# Patient Record
Sex: Male | Born: 1975 | Race: White | Hispanic: No | Marital: Married
Health system: Southern US, Community
[De-identification: ages and names within clinical notes are randomized; demographics above are authoritative.]

---

## 2020-04-20 DIAGNOSIS — Z20828 Contact with and (suspected) exposure to other viral communicable diseases: Secondary | ICD-10-CM | POA: Diagnosis not present

## 2020-10-20 DIAGNOSIS — M5440 Lumbago with sciatica, unspecified side: Secondary | ICD-10-CM | POA: Diagnosis not present

## 2021-05-15 DIAGNOSIS — M5441 Lumbago with sciatica, right side: Secondary | ICD-10-CM | POA: Diagnosis not present

## 2021-06-25 DIAGNOSIS — M545 Low back pain, unspecified: Secondary | ICD-10-CM | POA: Diagnosis not present

## 2021-08-23 DIAGNOSIS — M5441 Lumbago with sciatica, right side: Secondary | ICD-10-CM | POA: Diagnosis not present

## 2021-11-29 NOTE — Progress Notes (Signed)
?Terrilee Files D.O. ?[facility] ?[address] [place] [address] ?Phone: [phone] ?Subjective:   ?I, Richard Cline, am serving as a scribe for Dr. Antoine Primas. ? ?This visit occurred during the SARS-CoV-2 public health emergency.  Safety protocols were in place, including screening questions prior to the visit, additional usage of staff PPE, and extensive cleaning of exam room while observing appropriate contact time as indicated for disinfecting solutions.  ? ? ?I'm seeing this patient by the request  of:  Pcp, No ? ?CC: low back exam  ? ?[id number]  ?Richard Cline is a 46 y.o. male coming in with complaint of LBP. Patient states that he has pain since August 15th, 2022. Had back discectomy in 1997 on L4/L5. Has tried Prednisone and this helped his pain. Tried PRP in December as well as a nerve block. Pain is now in R piriformis that radiates down into his R calf. Patient prefers to stand today due to pain. Pain is 4/10 constantly. Patient tried PT and this made his back worse. Using Egoscue stretching method currently for pain relief.  ? ? ?  ? ?Social History  ? ?Socioeconomic History  ? Marital status: Married  ?  Spouse name: Not on file  ? Number of children: Not on file  ? Years of education: Not on file  ? Highest education level: Not on file  ?Occupational History  ? Not on file  ?Tobacco Use  ? Smoking status: Not on file  ? Smokeless tobacco: Not on file  ?Substance and Sexual Activity  ? Alcohol use: Not on file  ? Drug use: Not on file  ? Sexual activity: Not on file  ?Other Topics Concern  ? Not on file  ?Social History Narrative  ? Not on file  ? ?Social Determinants of Health  ? ?Financial Resource Strain: Not on file  ?Food Insecurity: Not on file  ?Transportation Needs: Not on file  ?Physical Activity: Not on file  ?Stress: Not on file  ?Social Connections: Not on file  ? ?Not on File ?No family history on file. ? ? ? ? ? ? ?Current Outpatient Medications (Other):  ?   gabapentin (NEURONTIN) 100 MG capsule, Take 2 capsules (200 mg total) by mouth at bedtime. ? ? ?Reviewed prior external information including notes and imaging from  ?primary care provider ?As well as notes that were available from care everywhere and other healthcare systems. ? ?Past medical history, social, surgical and family history all reviewed in electronic medical record.  No pertanent information unless stated regarding to the chief complaint.  ? ?Review of Systems: ? No headache, visual changes, nausea, vomiting, diarrhea, constipation, dizziness, abdominal pain, skin rash, fevers, chills, night sweats, weight loss, swollen lymph nodes, body aches, joint swelling, chest pain, shortness of breath, mood changes. POSITIVE muscle aches ? ?Objective  ?Blood pressure 120/90, pulse (!) 101, height 6' (1.829 m), weight 237 lb (107.5 kg), SpO2 98 %. ?  ?General: No apparent distress alert and oriented x3 mood and affect normal, dressed appropriately.  ?HEENT: Pupils equal, extraocular movements intact  ?Respiratory: Patient's speak in full sentences and does not appear short of breath  ?Cardiovascular: No lower extremity edema, non tender, no erythema  ?Gait normal with good balance and coordination.  ?MSK: Patient back exam shows the patient has significant loss of lordosis.  Significant positive radicular symptoms of the right side at 20 degrees.  Patient has mild weakness with dorsiflexion of the foot but does have 2+  out of 5 strength of the hip noted with hip abduction.  Patient also has weakness with hip flexion with 3 out of 5 strength compared to the contralateral side.  Deep tendon reflexes though do appear to be intact.  Patient does have atrophy of approximately half centimeter in diameter of the calf as well as on the distal quadricep compared to the contralateral leg.  Patient has some atrophy of the gluteal musculature on the right side as well positive Trendelenburg on the right ? ?  ?Impression and  Recommendations:  ?  ? ?The above documentation has been reviewed and is accurate and complete Judi Saa, DO ? ? ? ?

## 2021-11-30 ENCOUNTER — Other Ambulatory Visit: Payer: Self-pay

## 2021-11-30 ENCOUNTER — Ambulatory Visit (INDEPENDENT_AMBULATORY_CARE_PROVIDER_SITE_OTHER): Payer: BLUE CROSS/BLUE SHIELD | Admitting: Family Medicine

## 2021-11-30 ENCOUNTER — Ambulatory Visit (INDEPENDENT_AMBULATORY_CARE_PROVIDER_SITE_OTHER): Payer: BLUE CROSS/BLUE SHIELD

## 2021-11-30 VITALS — BP 120/90 | HR 101 | Ht 72.0 in | Wt 237.0 lb

## 2021-11-30 DIAGNOSIS — M5416 Radiculopathy, lumbar region: Secondary | ICD-10-CM | POA: Diagnosis not present

## 2021-11-30 DIAGNOSIS — M545 Low back pain, unspecified: Secondary | ICD-10-CM | POA: Diagnosis not present

## 2021-11-30 DIAGNOSIS — G8929 Other chronic pain: Secondary | ICD-10-CM

## 2021-11-30 DIAGNOSIS — R102 Pelvic and perineal pain: Secondary | ICD-10-CM | POA: Diagnosis not present

## 2021-11-30 DIAGNOSIS — M47816 Spondylosis without myelopathy or radiculopathy, lumbar region: Secondary | ICD-10-CM | POA: Diagnosis not present

## 2021-11-30 IMAGING — DX DG PELVIS 1-2V
1 series · 1 of 1 positions shown · non-contrast
Comparison: None

CLINICAL DATA: Low back pain, right hip radiculopathy

EXAM:
PELVIS - 1-2 VIEW

[pelvis ap]
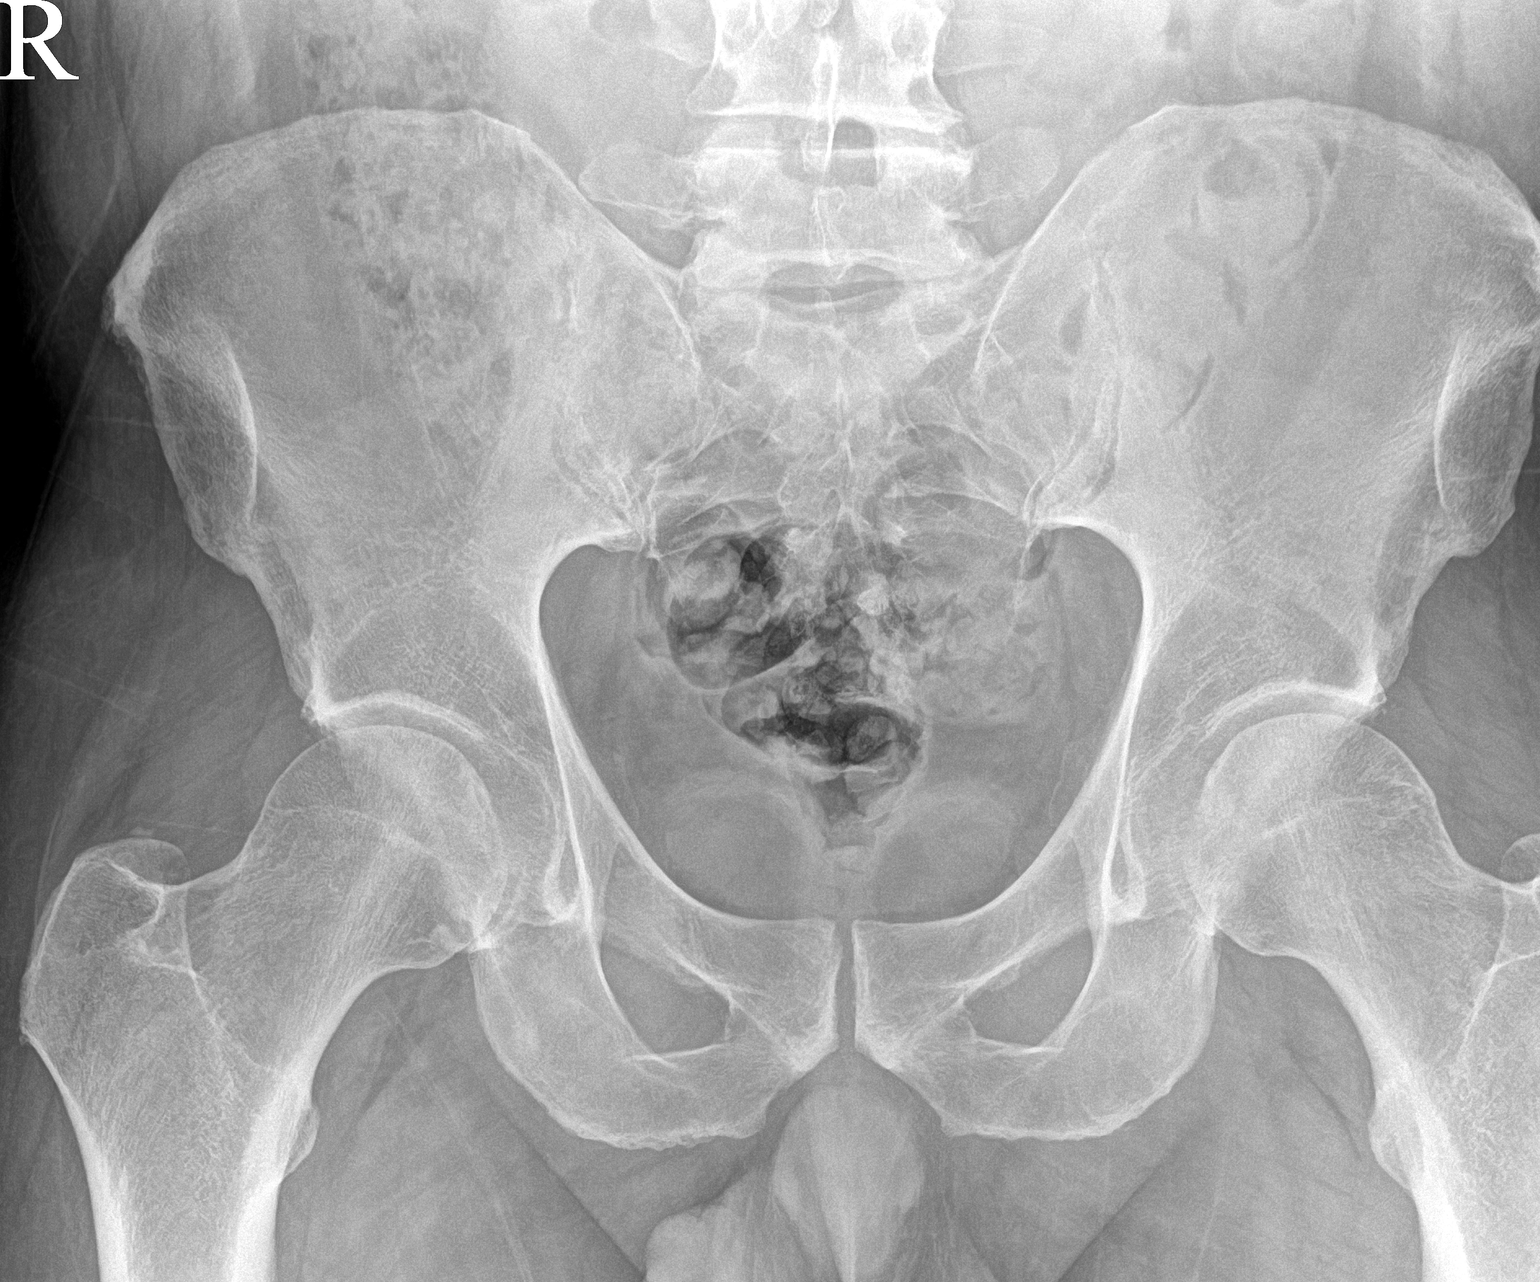

[1 of 1 positions shown; findings below may reference images not displayed]

FINDINGS: Supine frontal view of the pelvis excludes portions of the proximal
left femur and left iliac crest by collimation. No fracture,
subluxation, or dislocation. Joint spaces are well preserved. Soft
tissues are unremarkable.
IMPRESSION: 1. Unremarkable pelvis.

## 2021-11-30 IMAGING — DX DG LUMBAR SPINE 2-3V
3 series · 3 of 3 positions shown · non-contrast
Comparison: None.

CLINICAL DATA: Low back pain, right hip radiculopathy

EXAM:
LUMBAR SPINE - 2-3 VIEW

[l-spine ap]
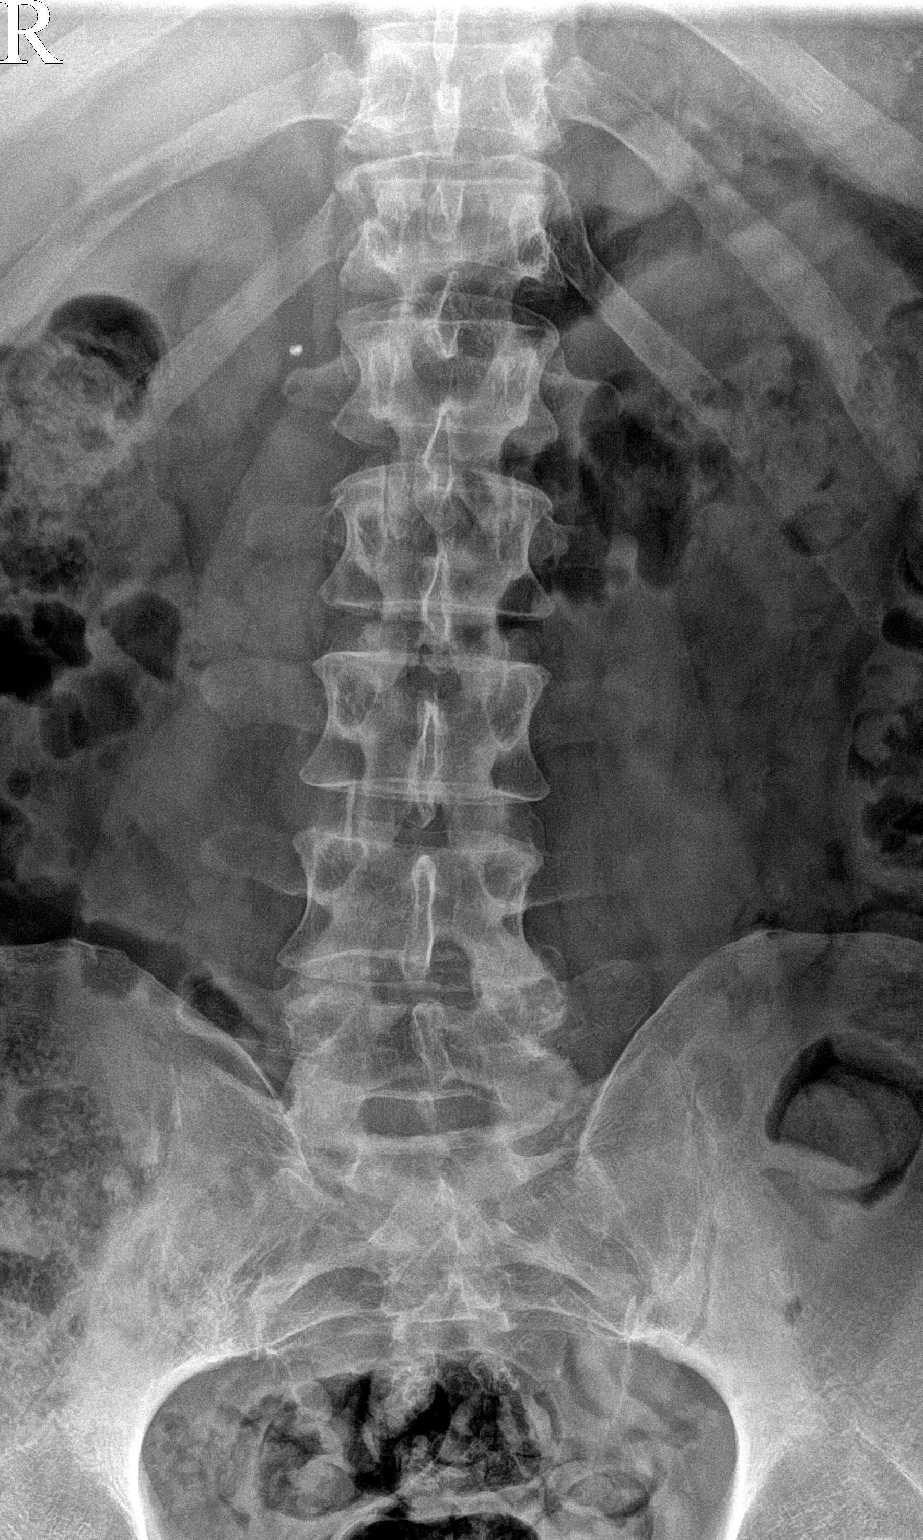

[l-spine lateral]
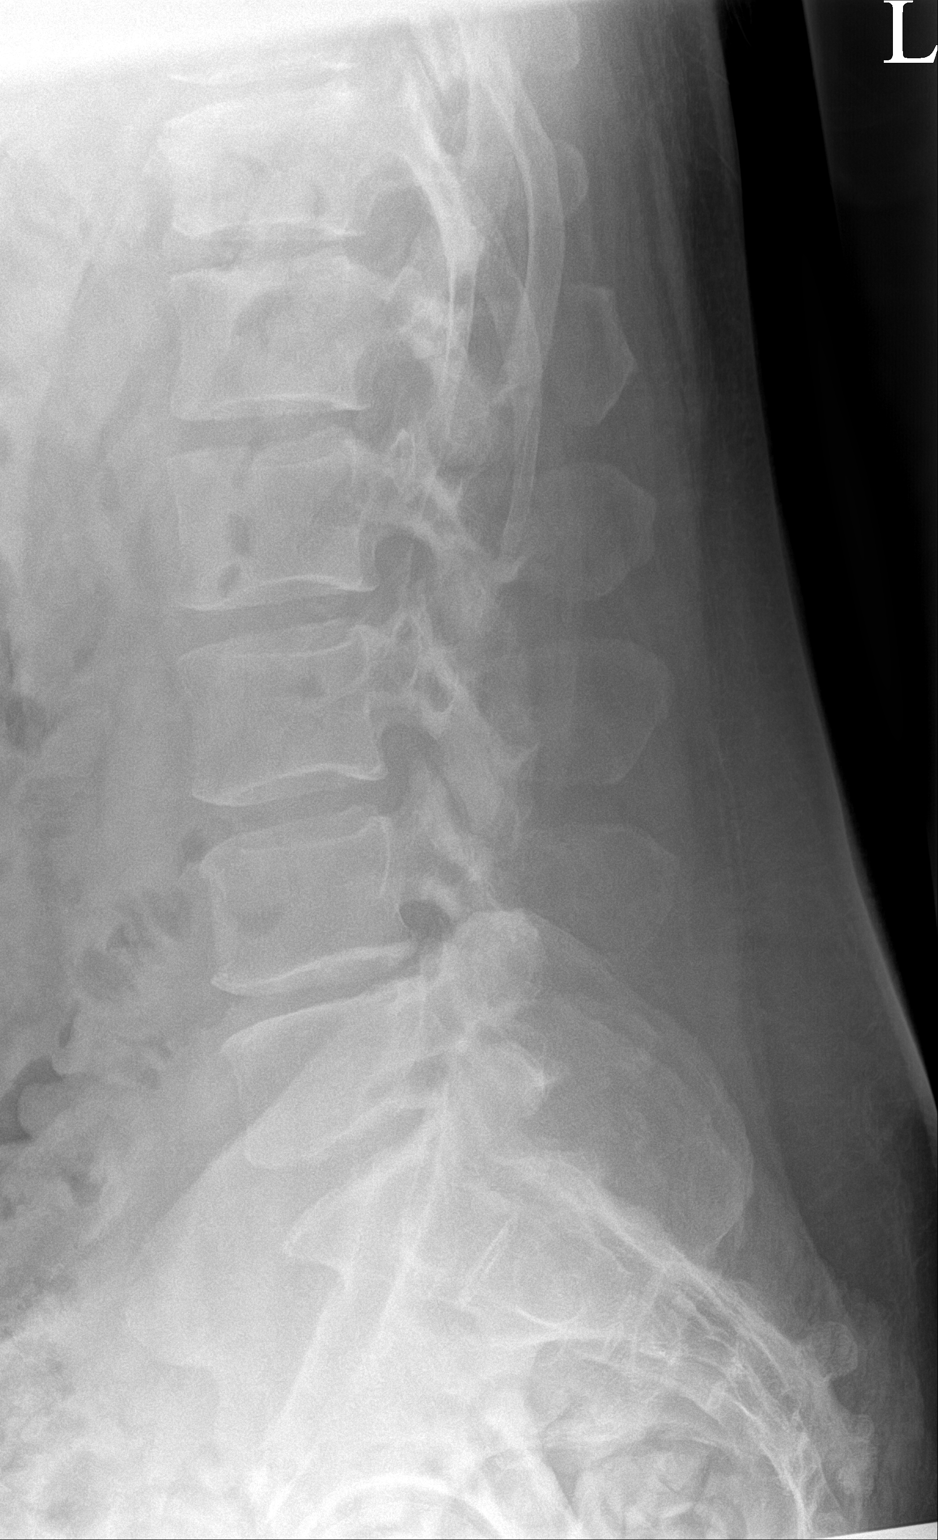

[l-spine spot]
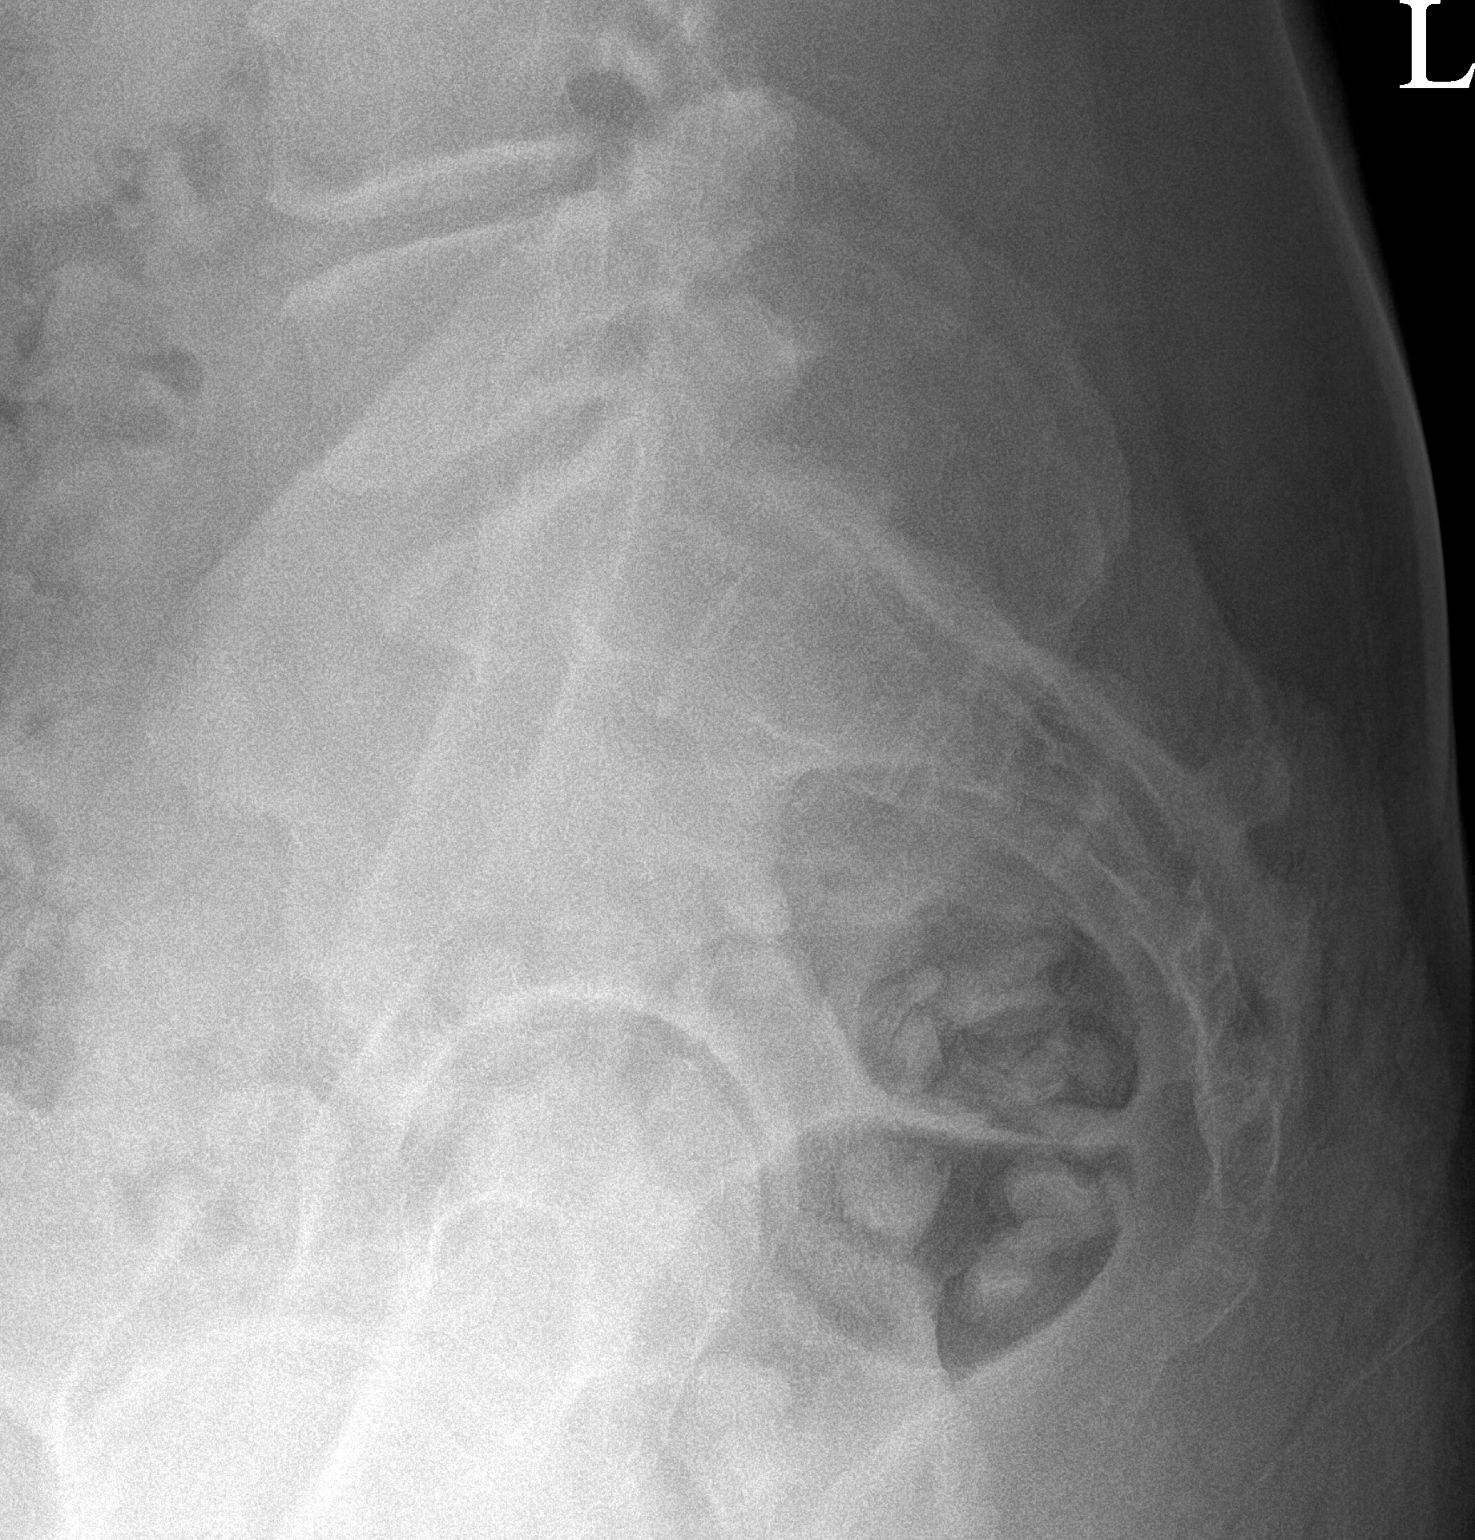

[3 of 3 positions shown; findings below may reference images not displayed]

FINDINGS: Frontal and lateral views of the lumbar spine are obtained. Five
non-rib-bearing lumbar type vertebral bodies in grossly normal
anatomic alignment. There is mild spondylosis and facet hypertrophy
at L4-5 and L5-S1. Sacroiliac joints are unremarkable.
IMPRESSION: 1. Mild lower lumbar spondylosis and facet hypertrophy. No acute
bony abnormality.

## 2021-11-30 MED ORDER — GABAPENTIN 100 MG PO CAPS: 200.0000 mg | ORAL_CAPSULE | Freq: Every day | ORAL | 0 refills | Status: AC

## 2021-11-30 NOTE — Patient Instructions (Addendum)
Xray lumbar and pelvis ?Gabapentin 200mg  at night ?MRI lumbar and pelvis ?Sign up for MyChart ?Make appt in 6 weeks ?

## 2021-11-30 NOTE — Assessment & Plan Note (Signed)
Low back does have some loss lordosis.  Some tenderness to palpation in the paraspinal musculature.  Patient unfortunately does have atrophy of the right lower extremity compared to the contralateral side.  Positive straight leg test noted with radicular symptoms.  Patient does have some weakness with hip flexion.  With this pain going on greater than 6 months and seen multiple providers with no significant improvement I am concerned that there is a fairly significant nerve root impingement causing this problem.  Started on gabapentin but I do feel x-rays and MRI are necessary at this time.  Patient is 46 years old and is unable to do stairs secondary to the amount of pain and weakness that he is having.  Depending on findings we will discuss different treatment options such as epidurals, nerve root injections, or even other conservative therapy follow-up with me again after imaging. ?

## 2021-12-15 ENCOUNTER — Other Ambulatory Visit: Payer: BLUE CROSS/BLUE SHIELD

## 2021-12-24 ENCOUNTER — Ambulatory Visit
Admission: RE | Admit: 2021-12-24 | Discharge: 2021-12-24 | Disposition: A | Discharge status: Home / Self Care | Payer: BLUE CROSS/BLUE SHIELD | Source: Ambulatory Visit | Attending: Family Medicine | Admitting: Family Medicine

## 2021-12-24 DIAGNOSIS — M48061 Spinal stenosis, lumbar region without neurogenic claudication: Secondary | ICD-10-CM | POA: Diagnosis not present

## 2021-12-24 DIAGNOSIS — M533 Sacrococcygeal disorders, not elsewhere classified: Secondary | ICD-10-CM | POA: Diagnosis not present

## 2021-12-24 DIAGNOSIS — M545 Low back pain, unspecified: Secondary | ICD-10-CM | POA: Diagnosis not present

## 2021-12-24 DIAGNOSIS — M47816 Spondylosis without myelopathy or radiculopathy, lumbar region: Secondary | ICD-10-CM | POA: Diagnosis not present

## 2021-12-24 DIAGNOSIS — M5416 Radiculopathy, lumbar region: Secondary | ICD-10-CM | POA: Diagnosis not present

## 2021-12-24 IMAGING — MR MR PELVIS W/O CM
5 of 8 series · 18 of 48 positions shown · non-contrast
Comparison: Pelvic radiographs [DATE] lumbar MRI [DATE]

CLINICAL DATA: Chronic low back pain radiating into the right lower
extremity. Previous back surgery.

EXAM:
MRI PELVIS WITHOUT CONTRAST
TECHNIQUE: Multiplanar multisequence MR imaging of the pelvis was performed. No
intravenous contrast was administered.

[Series 3: T1 · coronal · 3.0mm · 1.06mm/px · 4 of 44 slices shown (1 of 2)]
[im 1/44]
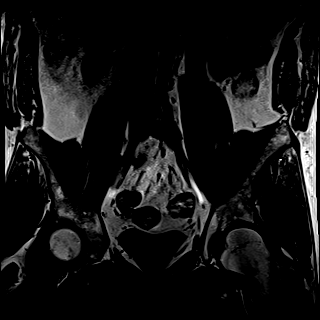
[im 15/44]
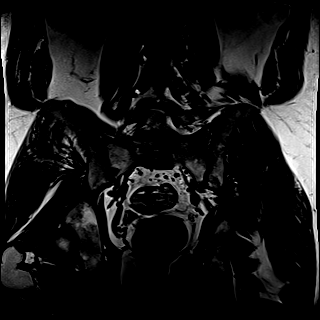
[im 29/44]
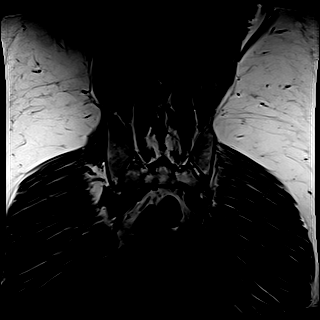
[im 44/44]
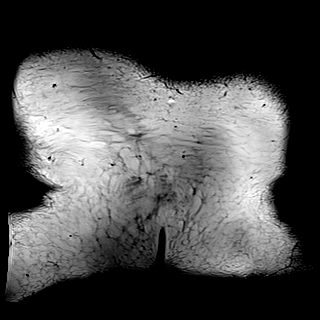

[Series 5: T2 · sagittal · 4.0mm · 1.12mm/px · 1 of 20 slices shown]
[im 1/20]
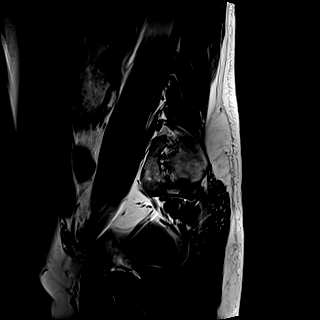

[Series 8: T2 fat-sat · axial · 4.0mm · 0.56mm/px · z∈[-285,+60]mm · 5 of 70 slices shown]
[im 1/70]
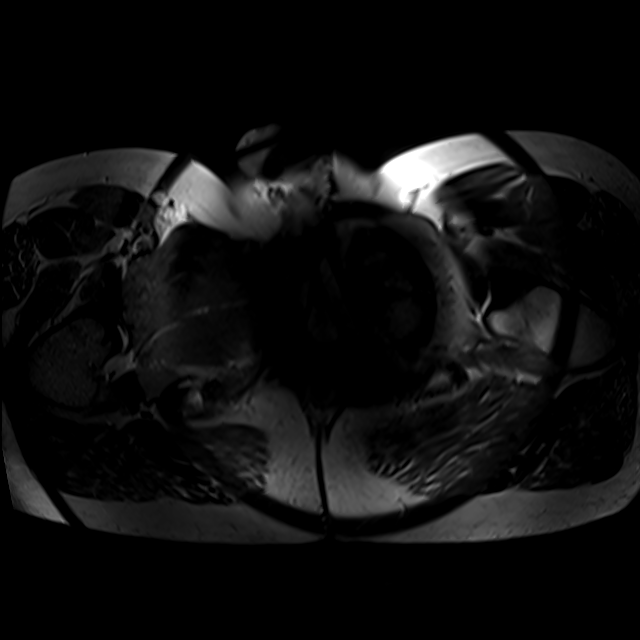
[im 18/70]
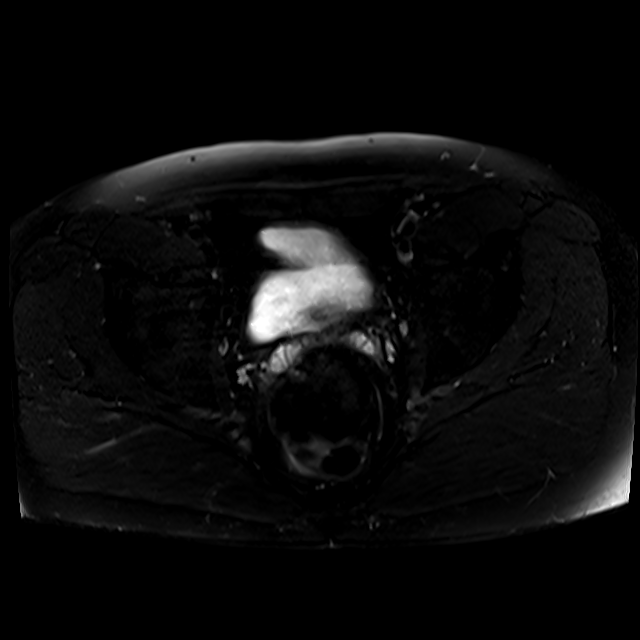
[im 35/70]
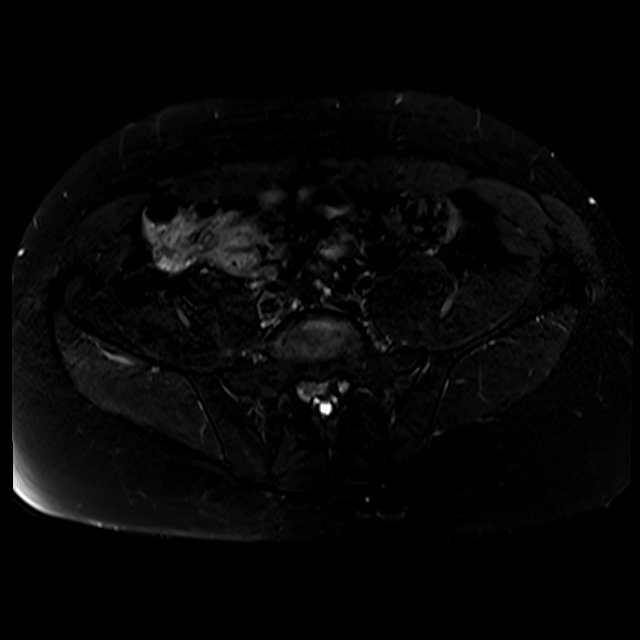
[im 52/70]
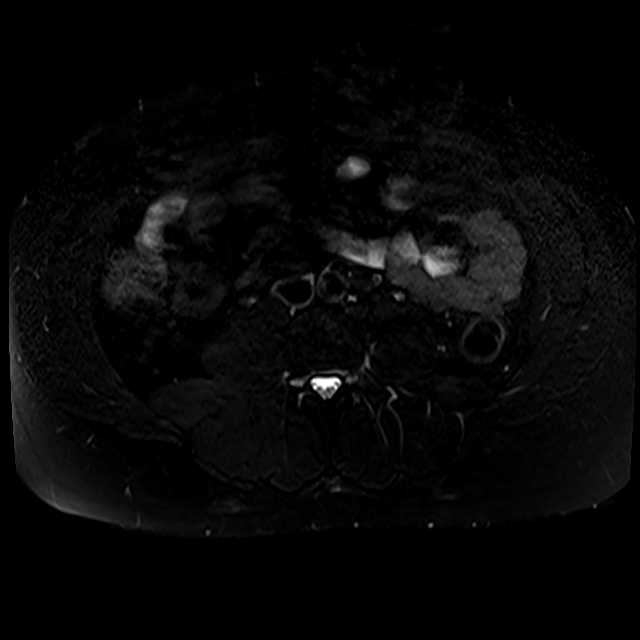
[im 70/70]
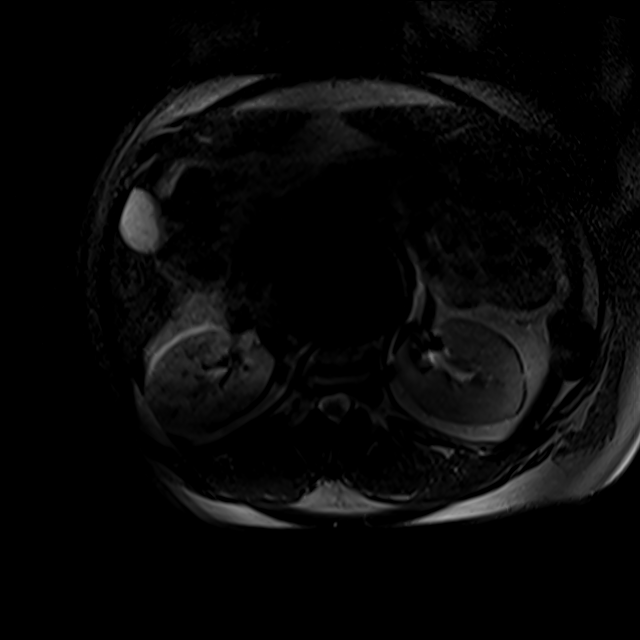

[Series 9: T1 · axial · 4.0mm · 1.41mm/px · z∈[-285,+60]mm · 5 of 70 slices shown (2 of 2)]
[im 1/70]
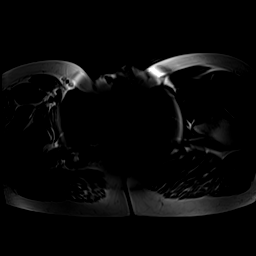
[im 18/70]
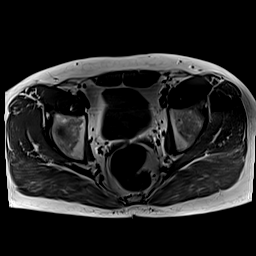
[im 35/70]
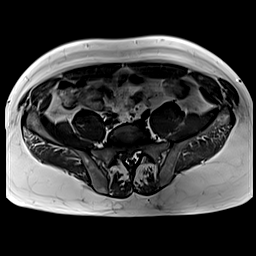
[im 52/70]
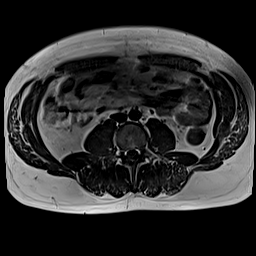
[im 70/70]
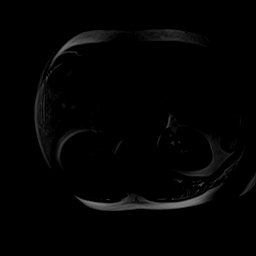

[Series 10: t2_space_stir_cor · coronal · 4.0mm · 0.88mm/px · 3 of 44 slices shown]
[im 1/44]
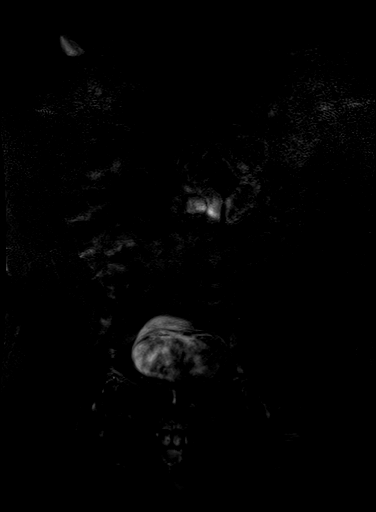
[im 22/44]
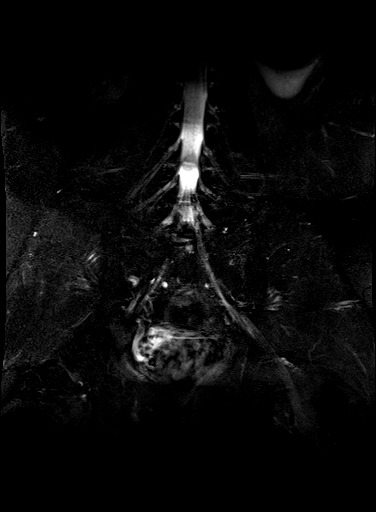
[im 44/44]
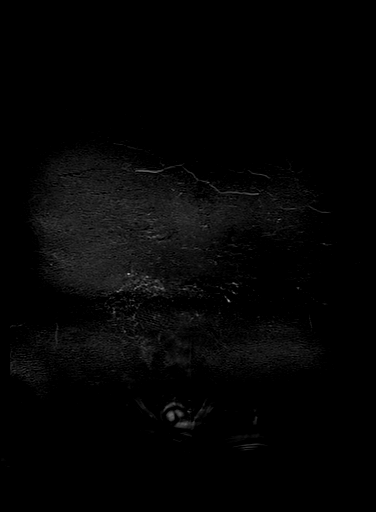

[18 of 48 positions shown; findings below may reference images not displayed]

FINDINGS: Urinary Tract: The visualized distal ureters and bladder appear
unremarkable.

Bowel: No bowel wall thickening, distention or surrounding
inflammation identified within the pelvis.

Vascular/Lymphatic: No enlarged pelvic lymph nodes identified. No
significant vascular findings.

Reproductive: The prostate gland and seminal vesicles appear
unremarkable.

Other: No evidence of pelvic ascites. The visualized anterior
abdominal wall is intact.

Musculoskeletal: No evidence of acute fracture, dislocation or
femoral head avascular necrosis. The hip joint spaces are preserved,
and there is no significant hip joint effusion. Mild sacroiliac
degenerative changes are present bilaterally. Lower lumbar spine
findings are dictated separately. The pelvic musculature appears
symmetric. The visualized pelvic tendons are intact.
IMPRESSION: 1. Sacroiliac degenerative changes bilaterally.
2. No acute pelvic findings.
3. The pelvic musculature and tendons appear unremarkable.
4. Lumbar spine findings dictated separately.

## 2021-12-24 IMAGING — MR MR LUMBAR SPINE W/O CM
4 of 5 series · 16 of 48 positions shown · non-contrast
Comparison: Lumbar spine radiographs [DATE].

CLINICAL DATA: Lumbar radiculopathy. Lumbar spine pain. Additional
history provided by scanning technologist: Patient reports chronic
pain for years, worse over the past 8 months. Prior microdiscectomy
surgeries 20 years ago. Pain radiates to right lower extremity.

EXAM:
MRI LUMBAR SPINE WITHOUT CONTRAST
TECHNIQUE: Multiplanar, multisequence MR imaging of the lumbar spine was
performed. No intravenous contrast was administered.

[Series 5: T2 · sagittal · 4.0mm · 0.73mm/px · 5 of 15 slices shown (1 of 2)]
[im 1/15]
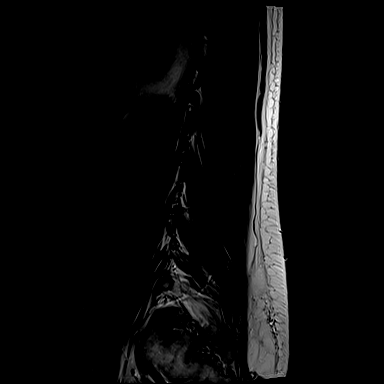
[im 4/15]
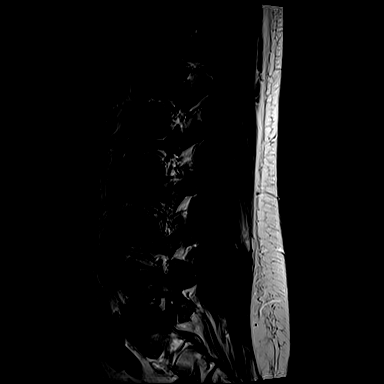
[im 8/15]
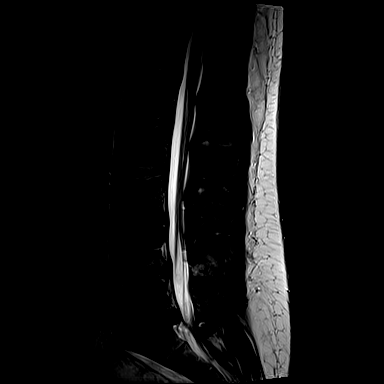
[im 11/15]
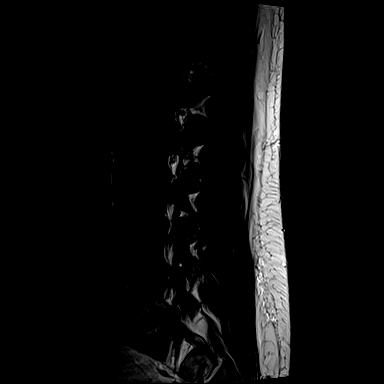
[im 15/15]
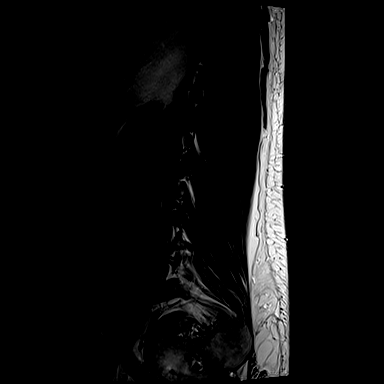

[Series 6: T1 · sagittal · 4.0mm · 0.73mm/px · 3 of 15 slices shown (1 of 2)]
[im 1/15]
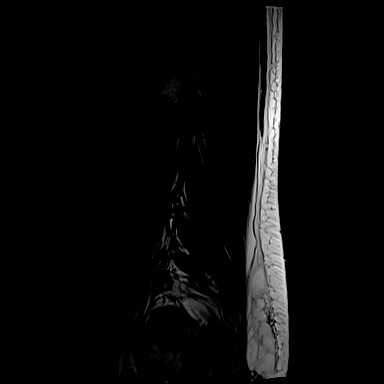
[im 8/15]
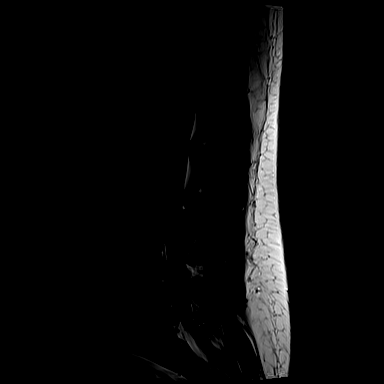
[im 15/15]
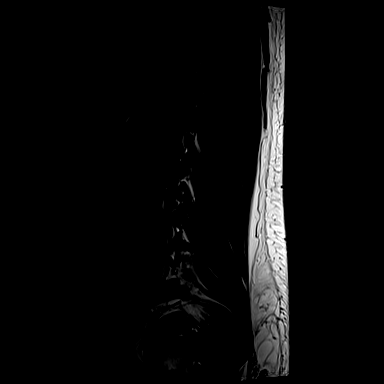

[Series 10: T1 · axial · 4.0mm · 0.28mm/px · z∈[-112,+58]mm · 3 of 42 slices shown (2 of 2)]
[im 6/42]
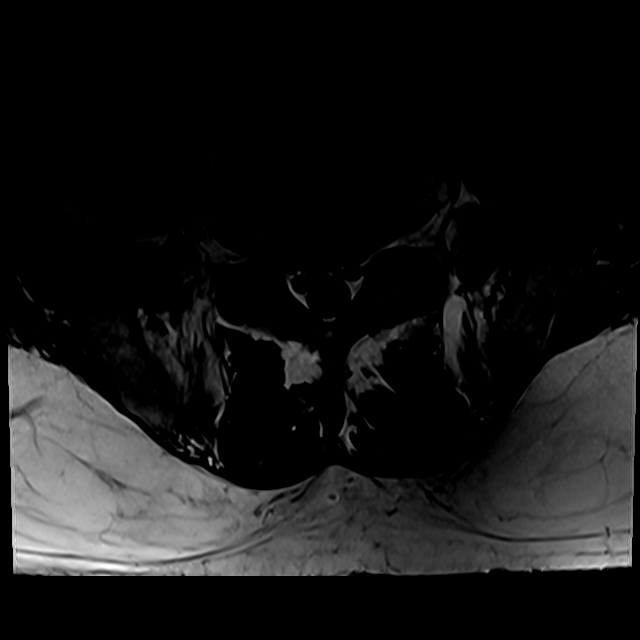
[im 22/42]
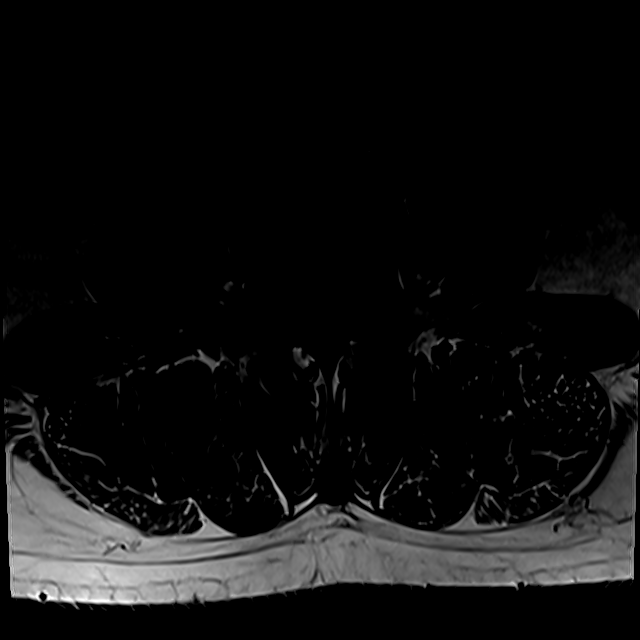
[im 36/42]
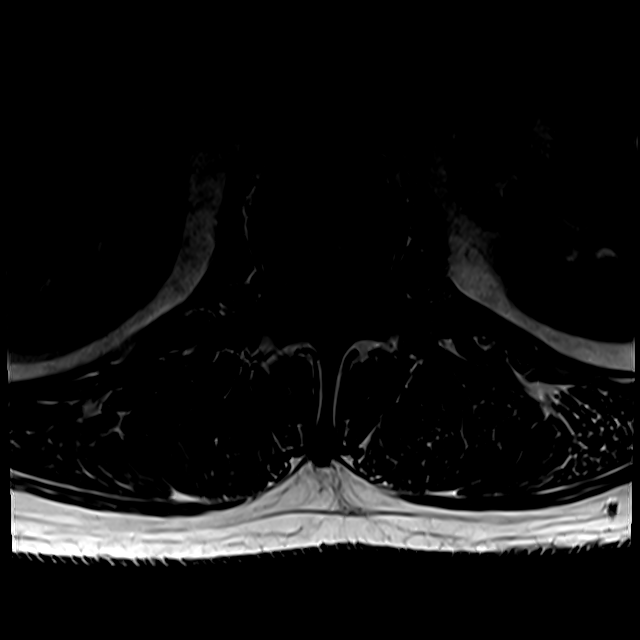

[Series 13: T2 · axial · 4.0mm · 0.28mm/px · z∈[-127,+58]mm · 5 of 42 slices shown (2 of 2)]
[im 3/42]
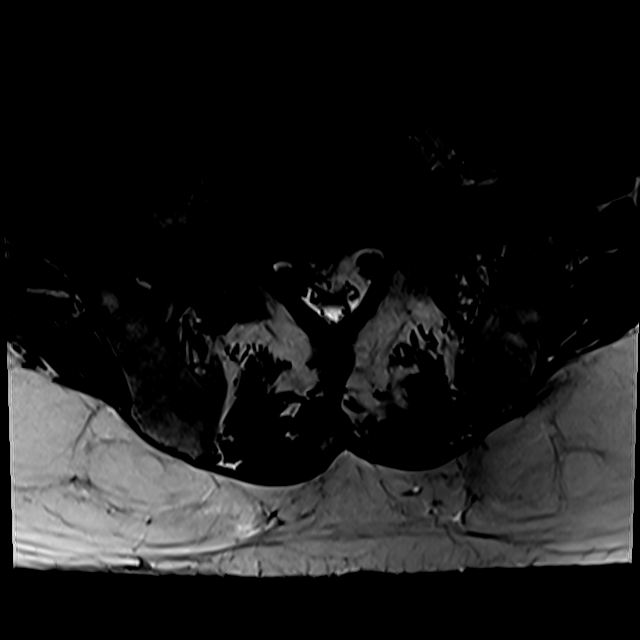
[im 6/42]
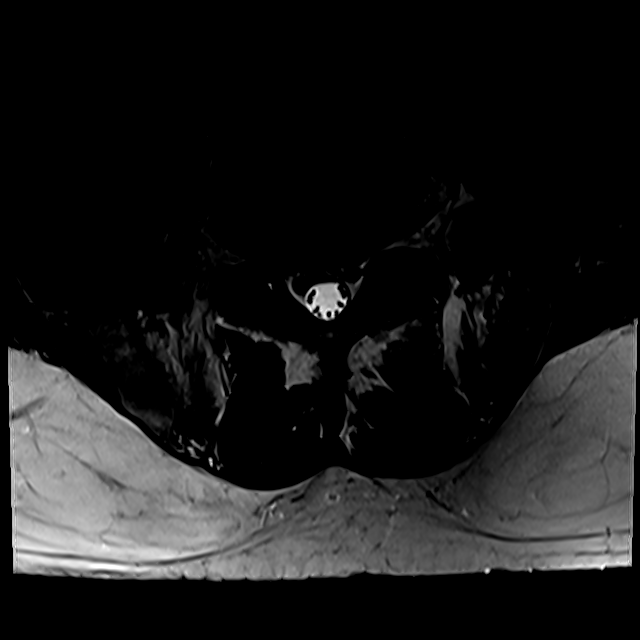
[im 9/42]
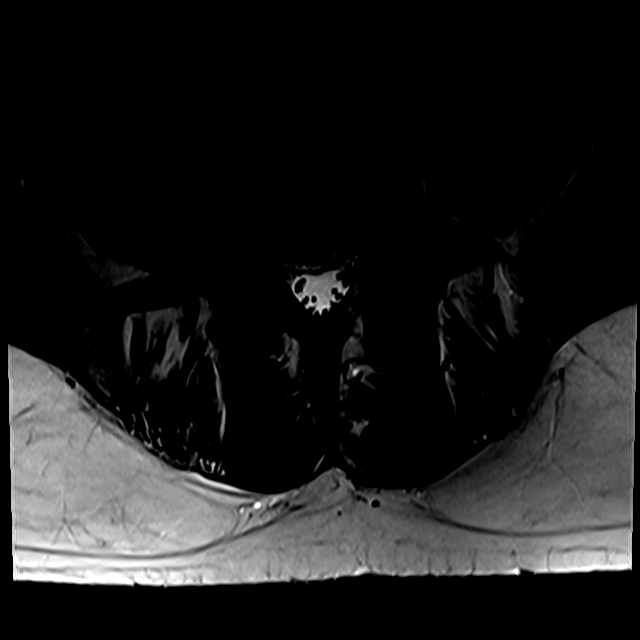
[im 22/42]
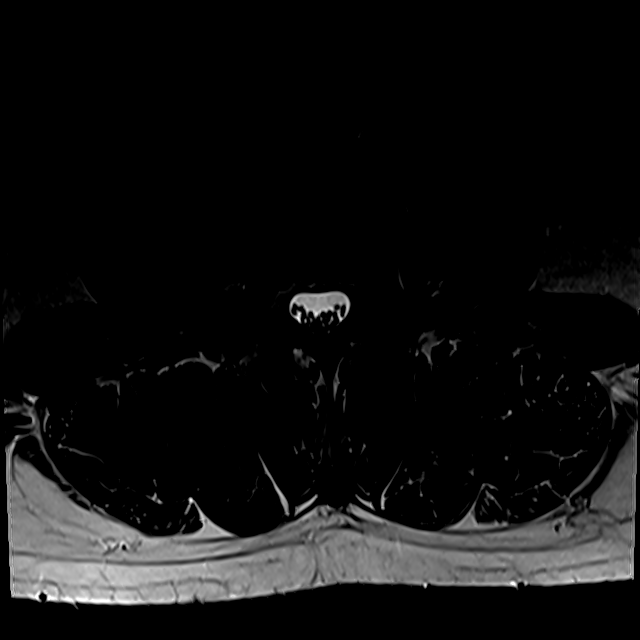
[im 36/42]
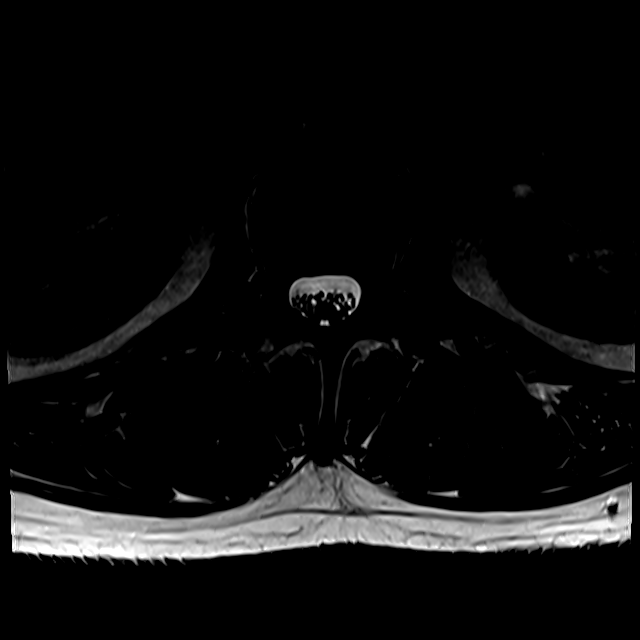

[16 of 48 positions shown; findings below may reference images not displayed]

FINDINGS: Segmentation: 5 lumbar vertebrae. The caudal most well-formed
intervertebral disc space is designated L5-S1.

Alignment:  No significant spondylolisthesis.

Vertebrae: No lumbar vertebral compression fracture. Multilevel
degenerative endplate irregularity. Fatty degenerative endplate
marrow signal at L4-L5 and L5-S1. Superimposed mild degenerative
endplate edema at L4-L5. Multilevel ventrolateral osteophytes.

Conus medullaris and cauda equina: Conus extends to the L2 level. No
signal abnormality within the visualized distal spinal cord.

Paraspinal and other soft tissues: No abnormality identified within
included portions of the abdomen/retroperitoneum. No paraspinal
mass.

Disc levels:

Multilevel disc degeneration, greatest at L4-L5 (moderate at this
level).

T11-T12: This level is imaged in the sagittal plane only. Small disc
bulge. Facet arthrosis and ligamentum flavum hypertrophy. No
significant spinal canal stenosis. Mild right neural foraminal
narrowing.

T12-L1: Small right center disc protrusion. The disc protrusion
results in minimal focal effacement of the ventral thecal sac
(without spinal cord mass effect). No significant foraminal
stenosis.

L1-L2: Mild facet arthrosis. No significant disc herniation or
stenosis.

L2-L3: Mild facet arthrosis. No significant disc herniation or
stenosis.

L3-L4: Central/right center posterior annular fissure. Disc bulge
with minimal endplate spurring. Mild facet arthrosis with ligamentum
flavum hypertrophy. Mild bilateral subarticular narrowing, greater
on the left, without nerve root impingement. Central canal patent.
No significant foraminal stenosis.

L4-L5: Prior left laminotomy and discectomy. Disc bulge with
endplate osteophytes. Possible superimposed tiny residual/recurrent
left center disc protrusion (series 5, image 9). Mild facet
arthrosis. No significant spinal canal stenosis. Mild left neural
foraminal narrowing.

L5-S1: Disc bulge. Superimposed broad-based central to right
subarticular disc extrusion with mild caudal migration. Endplate
spurring greatest along the right aspect of the disc space. The disc
extrusion results in severe right subarticular stenosis, encroaching
upon, and posteriorly displacing, the descending right S1 nerve root
(series 13, image 38). The disc extrusion also contributes to mild
left subarticular narrowing and may contact the descending left S1
nerve root. Mild bilateral neural foraminal narrowing (greater on
the right).
IMPRESSION: Lumbar spondylosis, as outlined and with findings most notably as
follows.

At L5-S1, a broad-based central to right subarticular caudally
migrated disc extrusion results in severe right subarticular
stenosis. The disc extrusion encroaches upon, and posteriorly
displaces, the descending right S1 nerve root. The disc extrusion
also contributes to mild left subarticular narrowing, and may
contact the descending left S1 nerve root. Multifactorial mild
bilateral neural foraminal narrowing.

At L4-L5, there is moderate disc degeneration with mild degenerative
endplate edema. There has been prior laminotomy and discectomy. Disc
bulge with endplate osteophytes. Possible superimposed tiny
residual/recurrent left center disc protrusion. Mild facet
arthrosis. No significant spinal canal stenosis. Mild left neural
foraminal narrowing.

At L3-L4, there is multifactorial mild left greater than right
subarticular narrowing (without appreciable nerve root impingement).

## 2021-12-26 ENCOUNTER — Encounter: Payer: Self-pay | Admitting: Family Medicine

## 2021-12-28 ENCOUNTER — Other Ambulatory Visit: Payer: Self-pay

## 2021-12-28 DIAGNOSIS — M5416 Radiculopathy, lumbar region: Secondary | ICD-10-CM

## 2022-01-04 ENCOUNTER — Ambulatory Visit
Admission: RE | Admit: 2022-01-04 | Discharge: 2022-01-04 | Disposition: A | Discharge status: Home / Self Care | Payer: BLUE CROSS/BLUE SHIELD | Source: Ambulatory Visit | Attending: Family Medicine | Admitting: Family Medicine

## 2022-01-04 DIAGNOSIS — M48061 Spinal stenosis, lumbar region without neurogenic claudication: Secondary | ICD-10-CM | POA: Diagnosis not present

## 2022-01-04 DIAGNOSIS — M5416 Radiculopathy, lumbar region: Secondary | ICD-10-CM | POA: Diagnosis not present

## 2022-01-04 MED ORDER — IOPAMIDOL (ISOVUE-M 200) INJECTION 41%
1.0000 mL | Freq: Once | INTRAMUSCULAR | Status: AC
Start: 2022-01-04 — End: 2022-01-04
  Administered 2022-01-04: 1 mL via EPIDURAL

## 2022-01-04 MED ORDER — METHYLPREDNISOLONE ACETATE 40 MG/ML INJ SUSP (RADIOLOG
80.0000 mg | Freq: Once | INTRAMUSCULAR | Status: AC
  Administered 2022-01-04: 80 mg via EPIDURAL

## 2022-01-04 NOTE — Discharge Instructions (Signed)

## 2022-01-18 NOTE — Progress Notes (Signed)
?Terrilee Files D.O. ?[facility] ?[address] [place] [address] ?Phone: [phone] ?Subjective:   ?I, Wilford Grist, am serving as a scribe for Dr. Antoine Primas. ? ?This visit occurred during the SARS-CoV-2 public health emergency.  Safety protocols were in place, including screening questions prior to the visit, additional usage of staff PPE, and extensive cleaning of exam room while observing appropriate contact time as indicated for disinfecting solutions.  ? ? ?I'm seeing this patient by the request  of:  Pcp, No ? ?CC: Back pain follow-up ? ?[id number]  ?11/30/2021 ?Low back does have some loss lordosis.  Some tenderness to palpation in the paraspinal musculature.  Patient unfortunately does have atrophy of the right lower extremity compared to the contralateral side.  Positive straight leg test noted with radicular symptoms.  Patient does have some weakness with hip flexion.  With this pain going on greater than 6 months and seen multiple providers with no significant improvement I am concerned that there is a fairly significant nerve root impingement causing this problem.  Started on gabapentin but I do feel x-rays and MRI are necessary at this time.  Patient is 46 years old and is unable to do stairs secondary to the amount of pain and weakness that he is having.  Depending on findings we will discuss different treatment options such as epidurals, nerve root injections, or even other conservative therapy follow-up with me again after imaging. ? ?Update 01/22/2022 ?Richard Cline is a 46 y.o. male coming in with complaint of lumbar spine pain. Epidural on 01/04/2022. Patient states that he is better. No longer in constant pain. Pain level when present is 3/10. Moving quickly and picking up items still increases his pain. Still not able to exercise how he would like to. Pain is localized to lumbar spine. If he has tingling it will be in back of R leg.  Reviewed patient's MRI in great  detail.  Patient did have a disc protrusion with extrusion noted causing displacement and impingement on the right S1 nerve root is significantly consistent with patient's pain.  Did have the injection on April 28.  Would state that he is 80% better but is still unable to increase his activity. ? ? ? ?Social History  ? ?Socioeconomic History  ? Marital status: Married  ?  Spouse name: Not on file  ? Number of children: Not on file  ? Years of education: Not on file  ? Highest education level: Not on file  ?Occupational History  ? Not on file  ?Tobacco Use  ? Smoking status: Not on file  ? Smokeless tobacco: Not on file  ?Substance and Sexual Activity  ? Alcohol use: Not on file  ? Drug use: Not on file  ? Sexual activity: Not on file  ?Other Topics Concern  ? Not on file  ?Social History Narrative  ? Not on file  ? ?Social Determinants of Health  ? ?Financial Resource Strain: Not on file  ?Food Insecurity: Not on file  ?Transportation Needs: Not on file  ?Physical Activity: Not on file  ?Stress: Not on file  ?Social Connections: Not on file  ? ?Not on File ?No family history on file. ? ? ? ? ? ? ?Current Outpatient Medications (Other):  ?  gabapentin (NEURONTIN) 100 MG capsule, Take 2 capsules (200 mg total) by mouth at bedtime. ? ? ?Reviewed prior external information including notes and imaging from  ?primary care provider ?As well as notes that were available  from care everywhere and other healthcare systems. ? ?Past medical history, social, surgical and family history all reviewed in electronic medical record.  No pertanent information unless stated regarding to the chief complaint.  ? ?Review of Systems: ? No headache, visual changes, nausea, vomiting, diarrhea, constipation, dizziness, abdominal pain, skin rash, fevers, chills, night sweats, weight loss, swollen lymph nodes, body aches, joint swelling, chest pain, shortness of breath, mood changes. POSITIVE muscle aches ? ?Objective  ?Blood pressure (!)  128/92, pulse 85, height 6' (1.829 m), weight 239 lb (108.4 kg), SpO2 98 %. ?  ?General: No apparent distress alert and oriented x3 mood and affect normal, dressed appropriately.  ?HEENT: Pupils equal, extraocular movements intact  ?Respiratory: Patient's speak in full sentences and does not appear short of breath  ?Cardiovascular: No lower extremity edema, non tender, no erythema  ?Gait normal with good balance and coordination.  ?MSK: Low back exam does have some passive lordosis.  Some tenderness to palpation in the the right side.  Patient does have a positive straight leg test at 35 degrees.  Still has some mild atrophy noted of the calf compared to the contralateral side but does have improvement in dorsiflexion and plantarflexion from previous exam. ?  ?Impression and Recommendations:  ?  ? ?The above documentation has been reviewed and is accurate and complete Judi Saa, DO ? ? ? ?

## 2022-01-23 ENCOUNTER — Ambulatory Visit (INDEPENDENT_AMBULATORY_CARE_PROVIDER_SITE_OTHER): Payer: BLUE CROSS/BLUE SHIELD | Admitting: Family Medicine

## 2022-01-23 VITALS — BP 128/92 | HR 85 | Ht 72.0 in | Wt 239.0 lb

## 2022-01-23 DIAGNOSIS — G8929 Other chronic pain: Secondary | ICD-10-CM

## 2022-01-23 DIAGNOSIS — M545 Low back pain, unspecified: Secondary | ICD-10-CM | POA: Diagnosis not present

## 2022-01-23 NOTE — Assessment & Plan Note (Signed)
Patient does have low back pain still.  Patient is though improving at the moment.  Still has a straight leg test.  Still having some tenderness to palpation in the paraspinal musculature on the right side of the back.  He did respond to about 70% of the injection he had previously.  At this point I would like to repeat the injection on the right side L5-S1 and see if he can improve that again.  Patient will continue with the gabapentin at this point.  He is able to start increasing activity.  Due to patient's job and traveling unable to do formal physical therapy.  Follow-up with me again in 5 to 6 weeks for next injection.  Total time reviewing chart, MRI and discussing with patient 33 minutes  ?

## 2022-01-23 NOTE — Patient Instructions (Signed)
Glad you are better ?Epidural [phone] ?Enjoy Vacation ?Ok to lift no extension and keep it light ?See me again in 5-6 weeks after next injection ?

## 2022-02-06 ENCOUNTER — Ambulatory Visit
Admission: RE | Admit: 2022-02-06 | Discharge: 2022-02-06 | Disposition: A | Discharge status: Home / Self Care | Payer: BLUE CROSS/BLUE SHIELD | Source: Ambulatory Visit | Attending: Family Medicine | Admitting: Family Medicine

## 2022-02-06 DIAGNOSIS — M5116 Intervertebral disc disorders with radiculopathy, lumbar region: Secondary | ICD-10-CM | POA: Diagnosis not present

## 2022-02-06 DIAGNOSIS — M545 Low back pain, unspecified: Secondary | ICD-10-CM

## 2022-02-06 DIAGNOSIS — M47817 Spondylosis without myelopathy or radiculopathy, lumbosacral region: Secondary | ICD-10-CM | POA: Diagnosis not present

## 2022-02-06 IMAGING — XA Imaging study
2 series · 2 of 2 positions shown · non-contrast
Comparison: none

CLINICAL DATA: Lumbosacral spondylosis without myelopathy with
radiculopathy. Right-sided low back/buttock pain radiating down the
back of the right leg. Large disc herniation at L5-S1 with right S1
nerve root impingement. Partial response to L5-S1 interlaminar
injection a month ago.

[Series 1: ortho standard · 1 of 1 slices shown (1 of 2)]
[im 1/1]
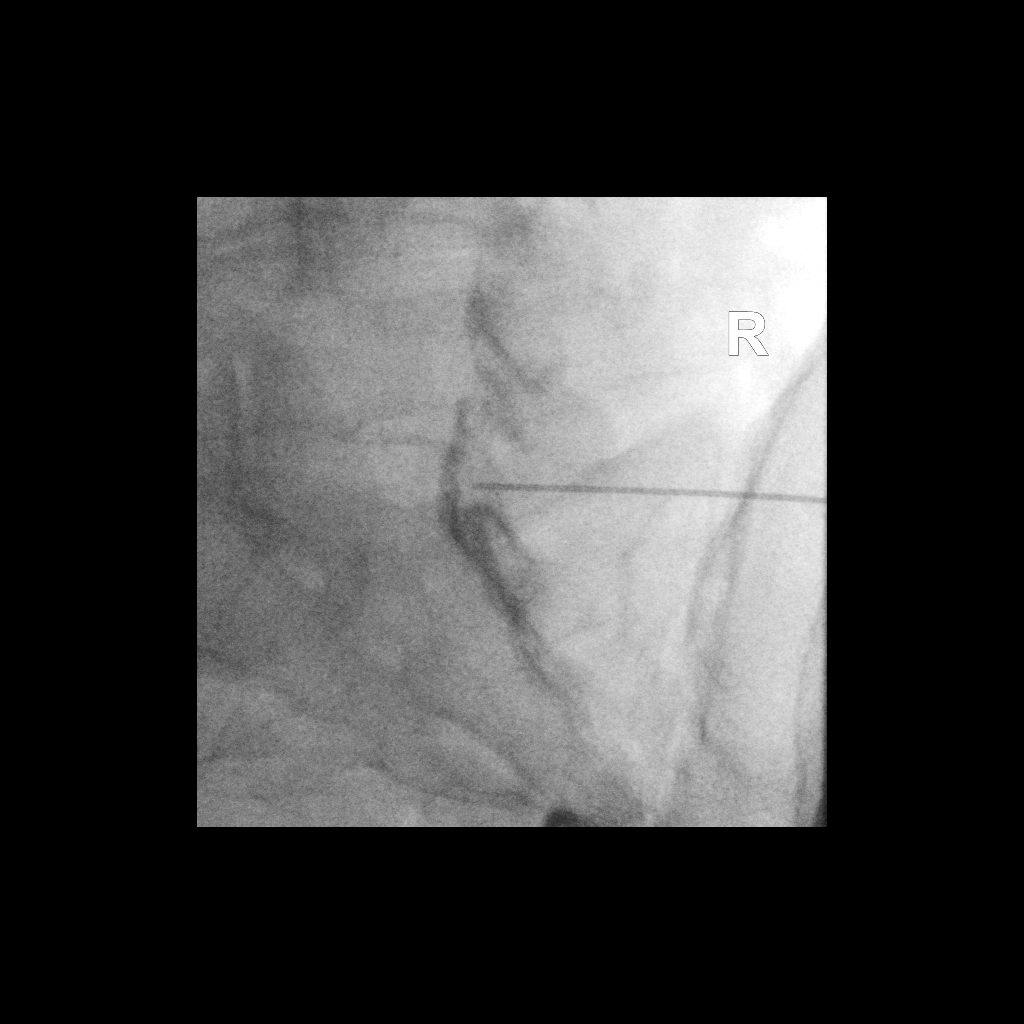

[Series 2: ortho standard · 1 of 1 slices shown (2 of 2)]
[im 1/1]
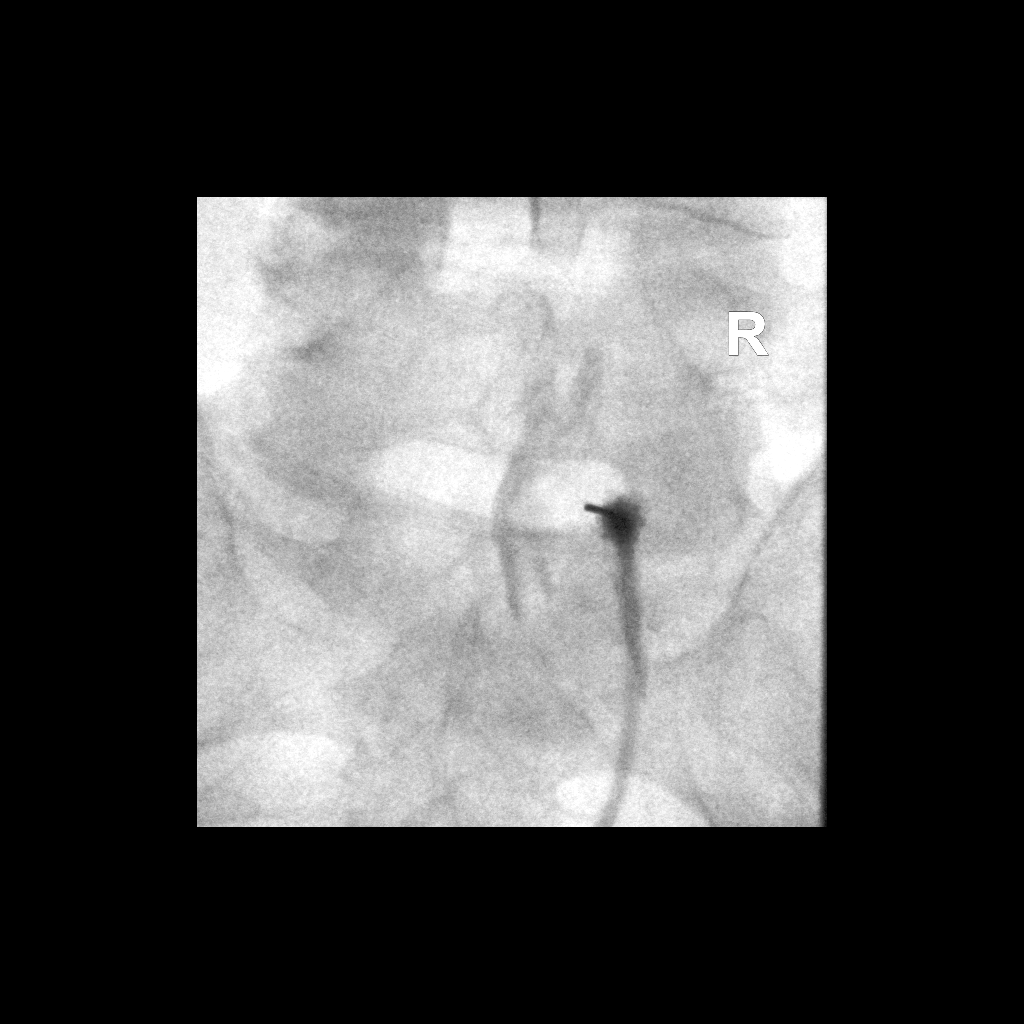

[2 of 2 positions shown; findings below may reference images not displayed]

FLUOROSCOPY:
Radiation Exposure Index (as provided by the fluoroscopic device):
2.5 mGy Kerma

PROCEDURE:
The procedure, risks, benefits, and alternatives were explained to
the patient. Questions regarding the procedure were encouraged and
answered. The patient understands and consents to the procedure.

LUMBAR EPIDURAL INJECTION:

An interlaminar approach was performed on the right at L5-S1. The
overlying skin was cleansed and anesthetized. A 3.5 inch 20 gauge
epidural needle was advanced using loss-of-resistance technique.

DIAGNOSTIC EPIDURAL INJECTION:

Injection of Isovue-M 200 shows a good epidural pattern with spread
above and below the level of needle placement, primarily on the
right. No vascular opacification is seen.

THERAPEUTIC EPIDURAL INJECTION:

80 mg of Depo-Medrol mixed with 3 mL of 1% lidocaine were instilled.
The procedure was well-tolerated, and the patient was discharged
thirty minutes following the injection in good condition.

COMPLICATIONS:
None immediate.
IMPRESSION: Technically successful interlaminar epidural injection on the right
at L5-S1.

## 2022-02-06 MED ORDER — METHYLPREDNISOLONE ACETATE 40 MG/ML INJ SUSP (RADIOLOG
80.0000 mg | Freq: Once | INTRAMUSCULAR | Status: AC
  Administered 2022-02-06: 80 mg via EPIDURAL

## 2022-02-06 MED ORDER — IOPAMIDOL (ISOVUE-M 200) INJECTION 41%
1.0000 mL | Freq: Once | INTRAMUSCULAR | Status: AC
  Administered 2022-02-06: 1 mL via EPIDURAL

## 2022-02-06 NOTE — Discharge Instructions (Signed)

## 2022-02-27 ENCOUNTER — Encounter: Payer: Self-pay | Admitting: Family Medicine

## 2022-02-28 ENCOUNTER — Other Ambulatory Visit: Payer: Self-pay

## 2022-02-28 DIAGNOSIS — G8929 Other chronic pain: Secondary | ICD-10-CM

## 2022-03-25 DIAGNOSIS — M9905 Segmental and somatic dysfunction of pelvic region: Secondary | ICD-10-CM | POA: Diagnosis not present

## 2022-03-25 DIAGNOSIS — M5116 Intervertebral disc disorders with radiculopathy, lumbar region: Secondary | ICD-10-CM | POA: Diagnosis not present

## 2022-03-25 DIAGNOSIS — M62451 Contracture of muscle, right thigh: Secondary | ICD-10-CM | POA: Diagnosis not present

## 2022-03-25 DIAGNOSIS — M9903 Segmental and somatic dysfunction of lumbar region: Secondary | ICD-10-CM | POA: Diagnosis not present

## 2022-03-25 DIAGNOSIS — M9902 Segmental and somatic dysfunction of thoracic region: Secondary | ICD-10-CM | POA: Diagnosis not present

## 2022-03-27 DIAGNOSIS — M9902 Segmental and somatic dysfunction of thoracic region: Secondary | ICD-10-CM | POA: Diagnosis not present

## 2022-03-27 DIAGNOSIS — M5116 Intervertebral disc disorders with radiculopathy, lumbar region: Secondary | ICD-10-CM | POA: Diagnosis not present

## 2022-03-27 DIAGNOSIS — M62451 Contracture of muscle, right thigh: Secondary | ICD-10-CM | POA: Diagnosis not present

## 2022-03-27 DIAGNOSIS — M9905 Segmental and somatic dysfunction of pelvic region: Secondary | ICD-10-CM | POA: Diagnosis not present

## 2022-03-27 DIAGNOSIS — M9903 Segmental and somatic dysfunction of lumbar region: Secondary | ICD-10-CM | POA: Diagnosis not present

## 2022-03-29 DIAGNOSIS — M9902 Segmental and somatic dysfunction of thoracic region: Secondary | ICD-10-CM | POA: Diagnosis not present

## 2022-03-29 DIAGNOSIS — M62451 Contracture of muscle, right thigh: Secondary | ICD-10-CM | POA: Diagnosis not present

## 2022-03-29 DIAGNOSIS — M9905 Segmental and somatic dysfunction of pelvic region: Secondary | ICD-10-CM | POA: Diagnosis not present

## 2022-03-29 DIAGNOSIS — M5116 Intervertebral disc disorders with radiculopathy, lumbar region: Secondary | ICD-10-CM | POA: Diagnosis not present

## 2022-03-29 DIAGNOSIS — M9903 Segmental and somatic dysfunction of lumbar region: Secondary | ICD-10-CM | POA: Diagnosis not present

## 2022-04-03 DIAGNOSIS — M62451 Contracture of muscle, right thigh: Secondary | ICD-10-CM | POA: Diagnosis not present

## 2022-04-03 DIAGNOSIS — M5116 Intervertebral disc disorders with radiculopathy, lumbar region: Secondary | ICD-10-CM | POA: Diagnosis not present

## 2022-04-03 DIAGNOSIS — M9902 Segmental and somatic dysfunction of thoracic region: Secondary | ICD-10-CM | POA: Diagnosis not present

## 2022-04-03 DIAGNOSIS — M9905 Segmental and somatic dysfunction of pelvic region: Secondary | ICD-10-CM | POA: Diagnosis not present

## 2022-04-03 DIAGNOSIS — M9903 Segmental and somatic dysfunction of lumbar region: Secondary | ICD-10-CM | POA: Diagnosis not present

## 2022-04-05 DIAGNOSIS — M9903 Segmental and somatic dysfunction of lumbar region: Secondary | ICD-10-CM | POA: Diagnosis not present

## 2022-04-05 DIAGNOSIS — M9905 Segmental and somatic dysfunction of pelvic region: Secondary | ICD-10-CM | POA: Diagnosis not present

## 2022-04-05 DIAGNOSIS — M5116 Intervertebral disc disorders with radiculopathy, lumbar region: Secondary | ICD-10-CM | POA: Diagnosis not present

## 2022-04-05 DIAGNOSIS — M62451 Contracture of muscle, right thigh: Secondary | ICD-10-CM | POA: Diagnosis not present

## 2022-04-09 DIAGNOSIS — M9903 Segmental and somatic dysfunction of lumbar region: Secondary | ICD-10-CM | POA: Diagnosis not present

## 2022-04-09 DIAGNOSIS — M62451 Contracture of muscle, right thigh: Secondary | ICD-10-CM | POA: Diagnosis not present

## 2022-04-09 DIAGNOSIS — M9905 Segmental and somatic dysfunction of pelvic region: Secondary | ICD-10-CM | POA: Diagnosis not present

## 2022-04-09 DIAGNOSIS — M5116 Intervertebral disc disorders with radiculopathy, lumbar region: Secondary | ICD-10-CM | POA: Diagnosis not present

## 2022-04-12 DIAGNOSIS — M9902 Segmental and somatic dysfunction of thoracic region: Secondary | ICD-10-CM | POA: Diagnosis not present

## 2022-04-12 DIAGNOSIS — M9903 Segmental and somatic dysfunction of lumbar region: Secondary | ICD-10-CM | POA: Diagnosis not present

## 2022-04-12 DIAGNOSIS — M5116 Intervertebral disc disorders with radiculopathy, lumbar region: Secondary | ICD-10-CM | POA: Diagnosis not present

## 2022-04-12 DIAGNOSIS — M62451 Contracture of muscle, right thigh: Secondary | ICD-10-CM | POA: Diagnosis not present

## 2022-04-12 DIAGNOSIS — M9905 Segmental and somatic dysfunction of pelvic region: Secondary | ICD-10-CM | POA: Diagnosis not present

## 2022-04-16 DIAGNOSIS — M62451 Contracture of muscle, right thigh: Secondary | ICD-10-CM | POA: Diagnosis not present

## 2022-04-16 DIAGNOSIS — M9905 Segmental and somatic dysfunction of pelvic region: Secondary | ICD-10-CM | POA: Diagnosis not present

## 2022-04-16 DIAGNOSIS — M9902 Segmental and somatic dysfunction of thoracic region: Secondary | ICD-10-CM | POA: Diagnosis not present

## 2022-04-16 DIAGNOSIS — M5116 Intervertebral disc disorders with radiculopathy, lumbar region: Secondary | ICD-10-CM | POA: Diagnosis not present

## 2022-04-16 DIAGNOSIS — M9903 Segmental and somatic dysfunction of lumbar region: Secondary | ICD-10-CM | POA: Diagnosis not present

## 2022-04-19 DIAGNOSIS — M9905 Segmental and somatic dysfunction of pelvic region: Secondary | ICD-10-CM | POA: Diagnosis not present

## 2022-04-19 DIAGNOSIS — M9903 Segmental and somatic dysfunction of lumbar region: Secondary | ICD-10-CM | POA: Diagnosis not present

## 2022-04-19 DIAGNOSIS — M5116 Intervertebral disc disorders with radiculopathy, lumbar region: Secondary | ICD-10-CM | POA: Diagnosis not present

## 2022-04-19 DIAGNOSIS — M62451 Contracture of muscle, right thigh: Secondary | ICD-10-CM | POA: Diagnosis not present

## 2022-04-19 DIAGNOSIS — M9902 Segmental and somatic dysfunction of thoracic region: Secondary | ICD-10-CM | POA: Diagnosis not present

## 2022-04-23 DIAGNOSIS — M5116 Intervertebral disc disorders with radiculopathy, lumbar region: Secondary | ICD-10-CM | POA: Diagnosis not present

## 2022-04-23 DIAGNOSIS — M9903 Segmental and somatic dysfunction of lumbar region: Secondary | ICD-10-CM | POA: Diagnosis not present

## 2022-04-23 DIAGNOSIS — M9902 Segmental and somatic dysfunction of thoracic region: Secondary | ICD-10-CM | POA: Diagnosis not present

## 2022-04-23 DIAGNOSIS — M9905 Segmental and somatic dysfunction of pelvic region: Secondary | ICD-10-CM | POA: Diagnosis not present

## 2022-04-23 DIAGNOSIS — M62451 Contracture of muscle, right thigh: Secondary | ICD-10-CM | POA: Diagnosis not present

## 2022-04-26 DIAGNOSIS — M5116 Intervertebral disc disorders with radiculopathy, lumbar region: Secondary | ICD-10-CM | POA: Diagnosis not present

## 2022-04-26 DIAGNOSIS — M9902 Segmental and somatic dysfunction of thoracic region: Secondary | ICD-10-CM | POA: Diagnosis not present

## 2022-04-26 DIAGNOSIS — M9903 Segmental and somatic dysfunction of lumbar region: Secondary | ICD-10-CM | POA: Diagnosis not present

## 2022-04-26 DIAGNOSIS — M9905 Segmental and somatic dysfunction of pelvic region: Secondary | ICD-10-CM | POA: Diagnosis not present

## 2022-04-26 DIAGNOSIS — M62451 Contracture of muscle, right thigh: Secondary | ICD-10-CM | POA: Diagnosis not present

## 2022-04-30 DIAGNOSIS — M9902 Segmental and somatic dysfunction of thoracic region: Secondary | ICD-10-CM | POA: Diagnosis not present

## 2022-04-30 DIAGNOSIS — M62451 Contracture of muscle, right thigh: Secondary | ICD-10-CM | POA: Diagnosis not present

## 2022-04-30 DIAGNOSIS — M9905 Segmental and somatic dysfunction of pelvic region: Secondary | ICD-10-CM | POA: Diagnosis not present

## 2022-04-30 DIAGNOSIS — M9903 Segmental and somatic dysfunction of lumbar region: Secondary | ICD-10-CM | POA: Diagnosis not present

## 2022-04-30 DIAGNOSIS — M5116 Intervertebral disc disorders with radiculopathy, lumbar region: Secondary | ICD-10-CM | POA: Diagnosis not present

## 2022-05-01 DIAGNOSIS — Z131 Encounter for screening for diabetes mellitus: Secondary | ICD-10-CM | POA: Diagnosis not present

## 2022-05-01 DIAGNOSIS — G8929 Other chronic pain: Secondary | ICD-10-CM | POA: Diagnosis not present

## 2022-05-01 DIAGNOSIS — M9904 Segmental and somatic dysfunction of sacral region: Secondary | ICD-10-CM | POA: Diagnosis not present

## 2022-05-01 DIAGNOSIS — D509 Iron deficiency anemia, unspecified: Secondary | ICD-10-CM | POA: Diagnosis not present

## 2022-05-01 DIAGNOSIS — E559 Vitamin D deficiency, unspecified: Secondary | ICD-10-CM | POA: Diagnosis not present

## 2022-05-01 DIAGNOSIS — Z77120 Contact with and (suspected) exposure to mold (toxic): Secondary | ICD-10-CM | POA: Diagnosis not present

## 2022-05-01 DIAGNOSIS — M545 Low back pain, unspecified: Secondary | ICD-10-CM | POA: Diagnosis not present

## 2022-05-01 DIAGNOSIS — A6920 Lyme disease, unspecified: Secondary | ICD-10-CM | POA: Diagnosis not present

## 2022-05-01 DIAGNOSIS — E538 Deficiency of other specified B group vitamins: Secondary | ICD-10-CM | POA: Diagnosis not present

## 2022-05-01 DIAGNOSIS — M47816 Spondylosis without myelopathy or radiculopathy, lumbar region: Secondary | ICD-10-CM | POA: Diagnosis not present

## 2022-05-01 DIAGNOSIS — M9905 Segmental and somatic dysfunction of pelvic region: Secondary | ICD-10-CM | POA: Diagnosis not present

## 2022-05-01 DIAGNOSIS — Z1329 Encounter for screening for other suspected endocrine disorder: Secondary | ICD-10-CM | POA: Diagnosis not present

## 2022-05-01 DIAGNOSIS — Z125 Encounter for screening for malignant neoplasm of prostate: Secondary | ICD-10-CM | POA: Diagnosis not present

## 2022-05-07 DIAGNOSIS — M9903 Segmental and somatic dysfunction of lumbar region: Secondary | ICD-10-CM | POA: Diagnosis not present

## 2022-05-07 DIAGNOSIS — M9902 Segmental and somatic dysfunction of thoracic region: Secondary | ICD-10-CM | POA: Diagnosis not present

## 2022-05-07 DIAGNOSIS — M5116 Intervertebral disc disorders with radiculopathy, lumbar region: Secondary | ICD-10-CM | POA: Diagnosis not present

## 2022-05-07 DIAGNOSIS — M62451 Contracture of muscle, right thigh: Secondary | ICD-10-CM | POA: Diagnosis not present

## 2022-05-07 DIAGNOSIS — M9905 Segmental and somatic dysfunction of pelvic region: Secondary | ICD-10-CM | POA: Diagnosis not present

## 2022-05-14 DIAGNOSIS — M62451 Contracture of muscle, right thigh: Secondary | ICD-10-CM | POA: Diagnosis not present

## 2022-05-14 DIAGNOSIS — M5116 Intervertebral disc disorders with radiculopathy, lumbar region: Secondary | ICD-10-CM | POA: Diagnosis not present

## 2022-05-14 DIAGNOSIS — M9902 Segmental and somatic dysfunction of thoracic region: Secondary | ICD-10-CM | POA: Diagnosis not present

## 2022-05-14 DIAGNOSIS — M9905 Segmental and somatic dysfunction of pelvic region: Secondary | ICD-10-CM | POA: Diagnosis not present

## 2022-05-14 DIAGNOSIS — M9903 Segmental and somatic dysfunction of lumbar region: Secondary | ICD-10-CM | POA: Diagnosis not present

## 2022-05-28 DIAGNOSIS — E039 Hypothyroidism, unspecified: Secondary | ICD-10-CM | POA: Diagnosis not present

## 2022-05-28 DIAGNOSIS — U099 Post covid-19 condition, unspecified: Secondary | ICD-10-CM | POA: Diagnosis not present

## 2022-06-05 DIAGNOSIS — M62451 Contracture of muscle, right thigh: Secondary | ICD-10-CM | POA: Diagnosis not present

## 2022-06-05 DIAGNOSIS — M9902 Segmental and somatic dysfunction of thoracic region: Secondary | ICD-10-CM | POA: Diagnosis not present

## 2022-06-05 DIAGNOSIS — M5116 Intervertebral disc disorders with radiculopathy, lumbar region: Secondary | ICD-10-CM | POA: Diagnosis not present

## 2022-06-05 DIAGNOSIS — M9903 Segmental and somatic dysfunction of lumbar region: Secondary | ICD-10-CM | POA: Diagnosis not present

## 2022-06-05 DIAGNOSIS — M9905 Segmental and somatic dysfunction of pelvic region: Secondary | ICD-10-CM | POA: Diagnosis not present

## 2022-06-18 DIAGNOSIS — M9903 Segmental and somatic dysfunction of lumbar region: Secondary | ICD-10-CM | POA: Diagnosis not present

## 2022-06-18 DIAGNOSIS — M9905 Segmental and somatic dysfunction of pelvic region: Secondary | ICD-10-CM | POA: Diagnosis not present

## 2022-06-18 DIAGNOSIS — M62451 Contracture of muscle, right thigh: Secondary | ICD-10-CM | POA: Diagnosis not present

## 2022-06-18 DIAGNOSIS — M5116 Intervertebral disc disorders with radiculopathy, lumbar region: Secondary | ICD-10-CM | POA: Diagnosis not present

## 2022-06-18 DIAGNOSIS — M9902 Segmental and somatic dysfunction of thoracic region: Secondary | ICD-10-CM | POA: Diagnosis not present

## 2022-06-25 DIAGNOSIS — M5116 Intervertebral disc disorders with radiculopathy, lumbar region: Secondary | ICD-10-CM | POA: Diagnosis not present

## 2022-06-25 DIAGNOSIS — M62451 Contracture of muscle, right thigh: Secondary | ICD-10-CM | POA: Diagnosis not present

## 2022-06-25 DIAGNOSIS — M9903 Segmental and somatic dysfunction of lumbar region: Secondary | ICD-10-CM | POA: Diagnosis not present

## 2022-06-25 DIAGNOSIS — M9905 Segmental and somatic dysfunction of pelvic region: Secondary | ICD-10-CM | POA: Diagnosis not present

## 2022-06-25 DIAGNOSIS — M9902 Segmental and somatic dysfunction of thoracic region: Secondary | ICD-10-CM | POA: Diagnosis not present

## 2022-07-01 DIAGNOSIS — M9902 Segmental and somatic dysfunction of thoracic region: Secondary | ICD-10-CM | POA: Diagnosis not present

## 2022-07-01 DIAGNOSIS — M9905 Segmental and somatic dysfunction of pelvic region: Secondary | ICD-10-CM | POA: Diagnosis not present

## 2022-07-01 DIAGNOSIS — M9903 Segmental and somatic dysfunction of lumbar region: Secondary | ICD-10-CM | POA: Diagnosis not present

## 2022-07-01 DIAGNOSIS — M62451 Contracture of muscle, right thigh: Secondary | ICD-10-CM | POA: Diagnosis not present

## 2022-07-01 DIAGNOSIS — M5116 Intervertebral disc disorders with radiculopathy, lumbar region: Secondary | ICD-10-CM | POA: Diagnosis not present

## 2022-07-08 DIAGNOSIS — M9903 Segmental and somatic dysfunction of lumbar region: Secondary | ICD-10-CM | POA: Diagnosis not present

## 2022-07-08 DIAGNOSIS — M9905 Segmental and somatic dysfunction of pelvic region: Secondary | ICD-10-CM | POA: Diagnosis not present

## 2022-07-08 DIAGNOSIS — M62451 Contracture of muscle, right thigh: Secondary | ICD-10-CM | POA: Diagnosis not present

## 2022-07-08 DIAGNOSIS — M5116 Intervertebral disc disorders with radiculopathy, lumbar region: Secondary | ICD-10-CM | POA: Diagnosis not present

## 2022-07-08 DIAGNOSIS — M9902 Segmental and somatic dysfunction of thoracic region: Secondary | ICD-10-CM | POA: Diagnosis not present

## 2022-07-30 DIAGNOSIS — M5116 Intervertebral disc disorders with radiculopathy, lumbar region: Secondary | ICD-10-CM | POA: Diagnosis not present

## 2022-07-30 DIAGNOSIS — M9902 Segmental and somatic dysfunction of thoracic region: Secondary | ICD-10-CM | POA: Diagnosis not present

## 2022-07-30 DIAGNOSIS — M9903 Segmental and somatic dysfunction of lumbar region: Secondary | ICD-10-CM | POA: Diagnosis not present

## 2022-07-30 DIAGNOSIS — M9905 Segmental and somatic dysfunction of pelvic region: Secondary | ICD-10-CM | POA: Diagnosis not present

## 2022-07-30 DIAGNOSIS — M62451 Contracture of muscle, right thigh: Secondary | ICD-10-CM | POA: Diagnosis not present

## 2022-08-07 DIAGNOSIS — M5116 Intervertebral disc disorders with radiculopathy, lumbar region: Secondary | ICD-10-CM | POA: Diagnosis not present

## 2022-08-07 DIAGNOSIS — M9902 Segmental and somatic dysfunction of thoracic region: Secondary | ICD-10-CM | POA: Diagnosis not present

## 2022-08-07 DIAGNOSIS — M9905 Segmental and somatic dysfunction of pelvic region: Secondary | ICD-10-CM | POA: Diagnosis not present

## 2022-08-07 DIAGNOSIS — M9903 Segmental and somatic dysfunction of lumbar region: Secondary | ICD-10-CM | POA: Diagnosis not present

## 2022-08-07 DIAGNOSIS — M62451 Contracture of muscle, right thigh: Secondary | ICD-10-CM | POA: Diagnosis not present

## 2022-08-12 DIAGNOSIS — M5116 Intervertebral disc disorders with radiculopathy, lumbar region: Secondary | ICD-10-CM | POA: Diagnosis not present

## 2022-08-12 DIAGNOSIS — M9905 Segmental and somatic dysfunction of pelvic region: Secondary | ICD-10-CM | POA: Diagnosis not present

## 2022-08-12 DIAGNOSIS — M9902 Segmental and somatic dysfunction of thoracic region: Secondary | ICD-10-CM | POA: Diagnosis not present

## 2022-08-12 DIAGNOSIS — M62451 Contracture of muscle, right thigh: Secondary | ICD-10-CM | POA: Diagnosis not present

## 2022-08-12 DIAGNOSIS — M9903 Segmental and somatic dysfunction of lumbar region: Secondary | ICD-10-CM | POA: Diagnosis not present

## 2022-08-19 DIAGNOSIS — M9905 Segmental and somatic dysfunction of pelvic region: Secondary | ICD-10-CM | POA: Diagnosis not present

## 2022-08-19 DIAGNOSIS — M9902 Segmental and somatic dysfunction of thoracic region: Secondary | ICD-10-CM | POA: Diagnosis not present

## 2022-08-19 DIAGNOSIS — M9903 Segmental and somatic dysfunction of lumbar region: Secondary | ICD-10-CM | POA: Diagnosis not present

## 2022-08-19 DIAGNOSIS — M62451 Contracture of muscle, right thigh: Secondary | ICD-10-CM | POA: Diagnosis not present

## 2022-08-19 DIAGNOSIS — M5116 Intervertebral disc disorders with radiculopathy, lumbar region: Secondary | ICD-10-CM | POA: Diagnosis not present

## 2022-08-22 DIAGNOSIS — E279 Disorder of adrenal gland, unspecified: Secondary | ICD-10-CM | POA: Diagnosis not present

## 2022-08-22 DIAGNOSIS — M1990 Unspecified osteoarthritis, unspecified site: Secondary | ICD-10-CM | POA: Diagnosis not present

## 2022-08-22 DIAGNOSIS — D513 Other dietary vitamin B12 deficiency anemia: Secondary | ICD-10-CM | POA: Diagnosis not present

## 2022-08-22 DIAGNOSIS — E039 Hypothyroidism, unspecified: Secondary | ICD-10-CM | POA: Diagnosis not present

## 2022-08-22 DIAGNOSIS — R7303 Prediabetes: Secondary | ICD-10-CM | POA: Diagnosis not present

## 2022-08-22 DIAGNOSIS — M5116 Intervertebral disc disorders with radiculopathy, lumbar region: Secondary | ICD-10-CM | POA: Diagnosis not present

## 2022-08-22 DIAGNOSIS — E538 Deficiency of other specified B group vitamins: Secondary | ICD-10-CM | POA: Diagnosis not present

## 2022-08-22 DIAGNOSIS — E559 Vitamin D deficiency, unspecified: Secondary | ICD-10-CM | POA: Diagnosis not present

## 2022-09-11 DIAGNOSIS — M62451 Contracture of muscle, right thigh: Secondary | ICD-10-CM | POA: Diagnosis not present

## 2022-09-11 DIAGNOSIS — M9902 Segmental and somatic dysfunction of thoracic region: Secondary | ICD-10-CM | POA: Diagnosis not present

## 2022-09-11 DIAGNOSIS — M9905 Segmental and somatic dysfunction of pelvic region: Secondary | ICD-10-CM | POA: Diagnosis not present

## 2022-09-11 DIAGNOSIS — M9903 Segmental and somatic dysfunction of lumbar region: Secondary | ICD-10-CM | POA: Diagnosis not present

## 2022-09-11 DIAGNOSIS — M5116 Intervertebral disc disorders with radiculopathy, lumbar region: Secondary | ICD-10-CM | POA: Diagnosis not present

## 2022-09-19 DIAGNOSIS — R7303 Prediabetes: Secondary | ICD-10-CM | POA: Diagnosis not present

## 2022-09-19 DIAGNOSIS — D513 Other dietary vitamin B12 deficiency anemia: Secondary | ICD-10-CM | POA: Diagnosis not present

## 2022-09-19 DIAGNOSIS — E279 Disorder of adrenal gland, unspecified: Secondary | ICD-10-CM | POA: Diagnosis not present

## 2022-09-19 DIAGNOSIS — E039 Hypothyroidism, unspecified: Secondary | ICD-10-CM | POA: Diagnosis not present

## 2022-10-03 DIAGNOSIS — M9900 Segmental and somatic dysfunction of head region: Secondary | ICD-10-CM | POA: Diagnosis not present

## 2022-10-03 DIAGNOSIS — M9901 Segmental and somatic dysfunction of cervical region: Secondary | ICD-10-CM | POA: Diagnosis not present

## 2022-10-03 DIAGNOSIS — M259 Joint disorder, unspecified: Secondary | ICD-10-CM | POA: Diagnosis not present

## 2022-10-03 DIAGNOSIS — M2420 Disorder of ligament, unspecified site: Secondary | ICD-10-CM | POA: Diagnosis not present

## 2022-11-14 DIAGNOSIS — E039 Hypothyroidism, unspecified: Secondary | ICD-10-CM | POA: Diagnosis not present

## 2022-11-14 DIAGNOSIS — M2420 Disorder of ligament, unspecified site: Secondary | ICD-10-CM | POA: Diagnosis not present

## 2022-11-14 DIAGNOSIS — R7303 Prediabetes: Secondary | ICD-10-CM | POA: Diagnosis not present

## 2022-11-14 DIAGNOSIS — M259 Joint disorder, unspecified: Secondary | ICD-10-CM | POA: Diagnosis not present

## 2022-11-14 DIAGNOSIS — M5116 Intervertebral disc disorders with radiculopathy, lumbar region: Secondary | ICD-10-CM | POA: Diagnosis not present

## 2022-11-14 DIAGNOSIS — Z1329 Encounter for screening for other suspected endocrine disorder: Secondary | ICD-10-CM | POA: Diagnosis not present

## 2022-11-14 DIAGNOSIS — E538 Deficiency of other specified B group vitamins: Secondary | ICD-10-CM | POA: Diagnosis not present

## 2022-11-14 DIAGNOSIS — M1990 Unspecified osteoarthritis, unspecified site: Secondary | ICD-10-CM | POA: Diagnosis not present

## 2022-11-14 DIAGNOSIS — E559 Vitamin D deficiency, unspecified: Secondary | ICD-10-CM | POA: Diagnosis not present

## 2023-07-22 DIAGNOSIS — Z0001 Encounter for general adult medical examination with abnormal findings: Secondary | ICD-10-CM | POA: Diagnosis not present

## 2023-07-30 DIAGNOSIS — E039 Hypothyroidism, unspecified: Secondary | ICD-10-CM | POA: Diagnosis not present

## 2023-07-30 DIAGNOSIS — R7303 Prediabetes: Secondary | ICD-10-CM | POA: Diagnosis not present

## 2023-07-30 DIAGNOSIS — Z125 Encounter for screening for malignant neoplasm of prostate: Secondary | ICD-10-CM | POA: Diagnosis not present

## 2023-07-30 DIAGNOSIS — U099 Post covid-19 condition, unspecified: Secondary | ICD-10-CM | POA: Diagnosis not present

## 2023-07-30 DIAGNOSIS — Z1329 Encounter for screening for other suspected endocrine disorder: Secondary | ICD-10-CM | POA: Diagnosis not present

## 2023-07-30 DIAGNOSIS — E559 Vitamin D deficiency, unspecified: Secondary | ICD-10-CM | POA: Diagnosis not present

## 2024-04-19 DIAGNOSIS — L03114 Cellulitis of left upper limb: Secondary | ICD-10-CM | POA: Diagnosis not present

## 2024-04-19 DIAGNOSIS — L237 Allergic contact dermatitis due to plants, except food: Secondary | ICD-10-CM | POA: Diagnosis not present

## 2024-08-18 DIAGNOSIS — Z0001 Encounter for general adult medical examination with abnormal findings: Secondary | ICD-10-CM | POA: Diagnosis not present
